# Patient Record
Sex: Female | Born: 1935 | Race: White | Hispanic: No | Marital: Single | Smoking: Former smoker
Health system: Southern US, Community
[De-identification: ages and names within clinical notes are randomized; demographics above are authoritative.]

## PROBLEM LIST (undated history)

## (undated) DIAGNOSIS — I472 Ventricular tachycardia: Secondary | ICD-10-CM

## (undated) DIAGNOSIS — I872 Venous insufficiency (chronic) (peripheral): Secondary | ICD-10-CM

## (undated) DIAGNOSIS — I4891 Unspecified atrial fibrillation: Secondary | ICD-10-CM

## (undated) DIAGNOSIS — I509 Heart failure, unspecified: Secondary | ICD-10-CM

## (undated) DIAGNOSIS — E039 Hypothyroidism, unspecified: Secondary | ICD-10-CM

## (undated) DIAGNOSIS — E785 Hyperlipidemia, unspecified: Secondary | ICD-10-CM

## (undated) DIAGNOSIS — I639 Cerebral infarction, unspecified: Secondary | ICD-10-CM

## (undated) DIAGNOSIS — I10 Essential (primary) hypertension: Secondary | ICD-10-CM

## (undated) DIAGNOSIS — I2510 Atherosclerotic heart disease of native coronary artery without angina pectoris: Secondary | ICD-10-CM

## (undated) DIAGNOSIS — I4721 Torsades de pointes: Secondary | ICD-10-CM

## (undated) DIAGNOSIS — N189 Chronic kidney disease, unspecified: Secondary | ICD-10-CM

## (undated) HISTORY — PX: CHOLECYSTECTOMY: SHX55

---

## 1997-12-14 ENCOUNTER — Ambulatory Visit (HOSPITAL_COMMUNITY): Admission: RE | Admit: 1997-12-14 | Discharge: 1997-12-14 | Payer: Self-pay | Admitting: Family Medicine

## 1998-09-20 ENCOUNTER — Encounter: Payer: Self-pay | Admitting: Family Medicine

## 1998-09-20 ENCOUNTER — Ambulatory Visit (HOSPITAL_COMMUNITY): Admission: RE | Admit: 1998-09-20 | Discharge: 1998-09-20 | Payer: Self-pay | Admitting: Family Medicine

## 1998-10-05 ENCOUNTER — Encounter: Payer: Self-pay | Admitting: General Surgery

## 1998-10-05 ENCOUNTER — Ambulatory Visit (HOSPITAL_COMMUNITY): Admission: RE | Admit: 1998-10-05 | Discharge: 1998-10-06 | Payer: Self-pay | Admitting: General Surgery

## 1999-03-01 ENCOUNTER — Other Ambulatory Visit: Admission: RE | Admit: 1999-03-01 | Discharge: 1999-03-01 | Payer: Self-pay | Admitting: Family Medicine

## 1999-11-05 ENCOUNTER — Encounter: Payer: Self-pay | Admitting: Family Medicine

## 1999-11-05 ENCOUNTER — Ambulatory Visit (HOSPITAL_COMMUNITY): Admission: RE | Admit: 1999-11-05 | Discharge: 1999-11-05 | Payer: Self-pay | Admitting: Family Medicine

## 1999-11-08 ENCOUNTER — Encounter: Payer: Self-pay | Admitting: Family Medicine

## 1999-11-08 ENCOUNTER — Ambulatory Visit (HOSPITAL_COMMUNITY): Admission: RE | Admit: 1999-11-08 | Discharge: 1999-11-08 | Payer: Self-pay | Admitting: Specialist

## 1999-11-08 ENCOUNTER — Encounter: Payer: Self-pay | Admitting: Specialist

## 1999-11-08 ENCOUNTER — Ambulatory Visit (HOSPITAL_COMMUNITY): Admission: RE | Admit: 1999-11-08 | Discharge: 1999-11-08 | Payer: Self-pay | Admitting: Family Medicine

## 1999-11-20 ENCOUNTER — Encounter: Admission: RE | Admit: 1999-11-20 | Discharge: 2000-02-18 | Payer: Self-pay | Admitting: Psychiatry

## 2000-06-04 ENCOUNTER — Other Ambulatory Visit: Admission: RE | Admit: 2000-06-04 | Discharge: 2000-06-04 | Payer: Self-pay | Admitting: Family Medicine

## 2000-08-06 ENCOUNTER — Encounter: Admission: RE | Admit: 2000-08-06 | Discharge: 2000-08-06 | Payer: Self-pay | Admitting: Family Medicine

## 2000-08-06 ENCOUNTER — Encounter: Payer: Self-pay | Admitting: Family Medicine

## 2001-12-16 ENCOUNTER — Encounter (HOSPITAL_BASED_OUTPATIENT_CLINIC_OR_DEPARTMENT_OTHER): Admission: RE | Admit: 2001-12-16 | Discharge: 2002-03-16 | Payer: Self-pay | Admitting: Internal Medicine

## 2002-03-22 ENCOUNTER — Encounter (HOSPITAL_BASED_OUTPATIENT_CLINIC_OR_DEPARTMENT_OTHER): Admission: RE | Admit: 2002-03-22 | Discharge: 2002-03-31 | Payer: Self-pay | Admitting: Internal Medicine

## 2003-09-21 ENCOUNTER — Inpatient Hospital Stay (HOSPITAL_COMMUNITY): Admission: EM | Admit: 2003-09-21 | Discharge: 2003-09-25 | Payer: Self-pay | Admitting: *Deleted

## 2003-09-21 ENCOUNTER — Encounter (INDEPENDENT_AMBULATORY_CARE_PROVIDER_SITE_OTHER): Payer: Self-pay | Admitting: Cardiology

## 2003-09-25 ENCOUNTER — Encounter: Payer: Self-pay | Admitting: Cardiology

## 2004-08-07 ENCOUNTER — Encounter (HOSPITAL_BASED_OUTPATIENT_CLINIC_OR_DEPARTMENT_OTHER): Admission: RE | Admit: 2004-08-07 | Discharge: 2004-09-13 | Payer: Self-pay | Admitting: Internal Medicine

## 2005-01-27 ENCOUNTER — Encounter (HOSPITAL_BASED_OUTPATIENT_CLINIC_OR_DEPARTMENT_OTHER): Admission: RE | Admit: 2005-01-27 | Discharge: 2005-04-27 | Payer: Self-pay | Admitting: Surgery

## 2005-04-29 ENCOUNTER — Encounter (HOSPITAL_BASED_OUTPATIENT_CLINIC_OR_DEPARTMENT_OTHER): Admission: RE | Admit: 2005-04-29 | Discharge: 2005-05-02 | Payer: Self-pay | Admitting: Surgery

## 2005-06-09 ENCOUNTER — Encounter (HOSPITAL_BASED_OUTPATIENT_CLINIC_OR_DEPARTMENT_OTHER): Admission: RE | Admit: 2005-06-09 | Discharge: 2005-06-29 | Payer: Self-pay | Admitting: Surgery

## 2005-07-01 ENCOUNTER — Encounter (HOSPITAL_BASED_OUTPATIENT_CLINIC_OR_DEPARTMENT_OTHER): Admission: RE | Admit: 2005-07-01 | Discharge: 2005-07-31 | Payer: Self-pay | Admitting: Surgery

## 2010-06-30 ENCOUNTER — Inpatient Hospital Stay (HOSPITAL_COMMUNITY)
Admission: EM | Admit: 2010-06-30 | Discharge: 2010-07-05 | Disposition: A | Discharge status: Other Acute Inpatient Hospital | Payer: Self-pay | Source: Home / Self Care | Attending: Cardiology | Admitting: Cardiology

## 2010-07-03 ENCOUNTER — Inpatient Hospital Stay (HOSPITAL_COMMUNITY)
Admission: AD | Admit: 2010-07-03 | Discharge: 2010-07-05 | Payer: Self-pay | Attending: Cardiology | Admitting: Cardiology

## 2010-07-03 LAB — CBC
HCT: 33.3 % — ABNORMAL LOW (ref 36.0–46.0)
Hemoglobin: 10.4 g/dL — ABNORMAL LOW (ref 12.0–15.0)
MCH: 27 pg (ref 26.0–34.0)
MCHC: 31.2 g/dL (ref 30.0–36.0)
MCV: 86.5 fL (ref 78.0–100.0)
Platelets: 154 10*3/uL (ref 150–400)
RBC: 3.85 MIL/uL — ABNORMAL LOW (ref 3.87–5.11)
RDW: 17.5 % — ABNORMAL HIGH (ref 11.5–15.5)
WBC: 5.2 10*3/uL (ref 4.0–10.5)

## 2010-07-03 LAB — PROTIME-INR
INR: 1.18 (ref 0.00–1.49)
Prothrombin Time: 15.2 seconds (ref 11.6–15.2)

## 2010-07-03 LAB — BASIC METABOLIC PANEL
BUN: 24 mg/dL — ABNORMAL HIGH (ref 6–23)
CO2: 26 mEq/L (ref 19–32)
Calcium: 9.2 mg/dL (ref 8.4–10.5)
Chloride: 103 mEq/L (ref 96–112)
Creatinine, Ser: 1.17 mg/dL (ref 0.4–1.2)
GFR calc Af Amer: 54 mL/min — ABNORMAL LOW (ref >60)
GFR calc non Af Amer: 45 mL/min — ABNORMAL LOW (ref >60)
Glucose, Bld: 112 mg/dL — ABNORMAL HIGH (ref 70–99)
Potassium: 4 mEq/L (ref 3.5–5.1)
Sodium: 137 mEq/L (ref 135–145)

## 2010-07-03 LAB — BRAIN NATRIURETIC PEPTIDE: Pro B Natriuretic peptide (BNP): 66.4 pg/mL (ref 0.0–100.0)

## 2010-07-03 LAB — HEPARIN LEVEL (UNFRACTIONATED): Heparin Unfractionated: 0.2 IU/mL — ABNORMAL LOW (ref 0.30–0.70)

## 2010-07-04 LAB — PROTIME-INR
INR: 1.13 (ref 0.00–1.49)
Prothrombin Time: 14.7 seconds (ref 11.6–15.2)

## 2010-07-04 LAB — BASIC METABOLIC PANEL
BUN: 20 mg/dL (ref 6–23)
CO2: 30 mEq/L (ref 19–32)
Calcium: 9.4 mg/dL (ref 8.4–10.5)
Chloride: 104 mEq/L (ref 96–112)
Creatinine, Ser: 1.22 mg/dL — ABNORMAL HIGH (ref 0.4–1.2)
GFR calc Af Amer: 52 mL/min — ABNORMAL LOW (ref >60)
GFR calc non Af Amer: 43 mL/min — ABNORMAL LOW (ref >60)
Glucose, Bld: 111 mg/dL — ABNORMAL HIGH (ref 70–99)
Potassium: 3.8 mEq/L (ref 3.5–5.1)
Sodium: 141 mEq/L (ref 135–145)

## 2010-07-04 LAB — CBC
HCT: 33.5 % — ABNORMAL LOW (ref 36.0–46.0)
Hemoglobin: 10.4 g/dL — ABNORMAL LOW (ref 12.0–15.0)
MCH: 26.8 pg (ref 26.0–34.0)
MCHC: 31 g/dL (ref 30.0–36.0)
MCV: 86.3 fL (ref 78.0–100.0)
Platelets: 158 10*3/uL (ref 150–400)
RBC: 3.88 MIL/uL (ref 3.87–5.11)
RDW: 17.5 % — ABNORMAL HIGH (ref 11.5–15.5)
WBC: 5.6 10*3/uL (ref 4.0–10.5)

## 2010-07-04 LAB — BRAIN NATRIURETIC PEPTIDE: Pro B Natriuretic peptide (BNP): 446 pg/mL — ABNORMAL HIGH (ref 0.0–100.0)

## 2010-07-05 LAB — BASIC METABOLIC PANEL
BUN: 18 mg/dL (ref 6–23)
CO2: 30 mEq/L (ref 19–32)
Calcium: 9.4 mg/dL (ref 8.4–10.5)
Chloride: 100 mEq/L (ref 96–112)
Creatinine, Ser: 1.26 mg/dL — ABNORMAL HIGH (ref 0.4–1.2)
GFR calc Af Amer: 50 mL/min — ABNORMAL LOW (ref >60)
GFR calc non Af Amer: 41 mL/min — ABNORMAL LOW (ref >60)
Glucose, Bld: 109 mg/dL — ABNORMAL HIGH (ref 70–99)
Potassium: 3.4 mEq/L — ABNORMAL LOW (ref 3.5–5.1)
Sodium: 139 mEq/L (ref 135–145)

## 2010-07-05 LAB — PROTIME-INR
INR: 1.59 — ABNORMAL HIGH (ref 0.00–1.49)
Prothrombin Time: 19.1 seconds — ABNORMAL HIGH (ref 11.6–15.2)

## 2010-07-29 NOTE — Discharge Summary (Signed)
NAME:  [name], [name]               ACCOUNT NO.:  [id number]  MEDICAL RECORD NO.:  [id number]          PATIENT TYPE:  INP  LOCATION:  6527                         FACILITY:  [facility]  PHYSICIAN:  Ezzard Standing, M.D.DATE OF BIRTH:  Jul 28, 1929  DATE OF ADMISSION:  07/03/2010 DATE OF DISCHARGE:  07/05/2010                              DISCHARGE SUMMARY   FINAL DIAGNOSES: 1. Acute systolic congestive heart failure, initial episode. 2. Cardiomyopathy, mixed picture ischemic and nonischemic. 3. Rapid atrial fibrillation with persistent atrial fibrillation. 4. Morbid obesity. 5. Previous history of stroke with residual left hemiparesis. 6. Hypothyroidism under treatment. 7. Hypertensive heart disease. 8. Hyperlipidemia. 9. Chronic venous insufficiency. 10.Previous history of torsade de pointes, on Avelox treatment.  PROCEDURES:  2D echocardiogram, cardiac catheterization.  HISTORY:  The patient is a 77 year old female, has a history of torsade de pointes that was induced by Avelox.  She had a previous stroke, hypertension and hyperlipidemia.  She presented to the emergency room with dyspnea for about 7 days.  She was seen by primary care physician and treated for bronchitis and later went to an Urgent Care who found her to be in atrial fibrillation with rapid response, and she was sent to the emergency room for further evaluation.  She has chronic lower extremity edema and chronic venous insufficiency there.  She was admitted for treatment of rapid atrial fibrillation to [facility].  Please see the previously dictated history and physical for remainder of the details.  HOSPITAL COURSE:  Hemoglobin is 11.4, hematocrit 36.4, INR is 1.28 on admission.  Sodium is 142, potassium is 3.4, chloride 104, CO2 of 27, glucose 153, BUN is 20 with creatinine of 1.47.  Liver enzymes were normal.  Calcium was 10.2.  B-natriuretic peptide was 810, T4 was 1.57, TSH is 2.733, troponin is  0.03.  The patient was admitted to the hospital and begun on intravenous Cardizem.  She had an echocardiogram done the next day that showed an ejection fraction of 20-25% and significant mitral regurgitation.  There were regional wall motion abnormalities noted.  Her atrial fibrillation came under control with diltiazem, and she was placed on heparin.  She was transferred to [facility], underwent catheterization on the 4th.  She had coronary artery disease identified with an occluded distal right coronary artery and left to right collaterals.  She had moderately diffuse disease involving the LAD and the circumflex with the coronary artery disease of somewhat out of proportion to the degree of LV dysfunction.  She had significant mitral regurgitation also noted.  Because of this, it was thought because of her age, obesity, and previous stroke that she would be treated medically.  She was initiated on treatment with Benicar that she had previously been on and begun on beta-blocker therapy, her atrial fibrillation rate came under control.  She was treated with warfarin for anticoagulation and had an INR of 1.59 on the day of discharge, and will continue to drift up from here.  She tolerated the catheterization well, and she was quite sedentary, but felt to be stable at the time of discharge.  Her BNP level  fell to 440.  She is discharged at this time on the following medications: 1. Carvedilol 6.25 b.i.d. 2. Diltiazem sustained release 120 mg b.i.d. 3. Furosemide 40 mg daily. 4. K-Dur 20 mEq daily. 5. Lipitor 40 mg daily. 6. Benicar 40 mg daily. 7. Spironolactone 25 mg daily. 8. Warfarin 5 mg daily. 9. Aspirin 81 mg daily. 10.Alprazolam 0.5 mg at bedtime as needed for sleep. 11.Calcium carbonate/vitamin D daily. 12.Levothyroxine 0.175 mg daily. 13.Oxybutynin 5 mg t.i.d.  She is instructed to weigh daily.  She was given instructions regarding warfarin anticoagulation.  She  may need to have a sleep study as an outpatient.  At the present time, she is not deemed a candidate for cardioversion for antiarrhythmic therapy because of her previous history of torsade de pointes.  She is to follow up with me in 1 week for protime in office visit.  She is to have a protime drawn on Monday by home health nurse.  She was also given a referral to home health nurse and also have a bedside commode.  ADDITIONAL DIAGNOSES:  Coronary artery disease and mitral regurgitation.     Ezzard Standing, M.D.     WST/MEDQ  D:  07/05/2010  T:  07/06/2010  Job:  [id number]  cc:   [name], M.D.  Electronically Signed by Viona Gilmore. Wynonia Lawman M.D. on 07/11/2010 05:32:48 PM

## 2010-07-29 NOTE — Procedures (Signed)
NAME:  [name], [name] NO.:  [id number]  MEDICAL RECORD NO.:  [id number]          PATIENT TYPE:  INP  LOCATION:  6527                         FACILITY:  [facility]  PHYSICIAN:  Ezzard Standing, M.D.DATE OF BIRTH:  [birth year]  DATE OF PROCEDURE:  07/03/2010                            CARDIAC CATHETERIZATION   HISTORY:  This is a 77 year old female who presented to the hospital with congestive heart failure and rapid atrial fibrillation.  She was found to have an ejection fraction of 20-25% with inferior wall motion abnormality.  Catheterization is done to assess for coronary artery disease as the possible etiology for her LV function decrease.  PROCEDURE:  Left heart catheterization with coronary angiograms and left ventriculogram.  OPERATOR:  W. Tollie Eth, MD  PROCEDURE IN DETAIL:  The patient was brought to the cath lab from [facility] and was prepped and draped in the usual manner. After Xylocaine anesthesia, a 5-French sheath was placed in the right femoral artery percutaneously using a single anterior needle wall stick. Angiograms were made using 5-French catheters and a 25 mL ventriculogram was performed.  She was then taken to the holding area for sheath removal and tolerated the procedure well.  HEMODYNAMIC DATA:  Aorta precontrast 120/24-28 and aorta postcontrast 120/80.  ANGIOGRAPHIC DATA:  Left ventriculogram:  Performed in the 30 degrees RAO projection.  The aortic valve is normal.  There appears to be severe mitral regurgitation with a dilated left atrium.  The ventricle is dilated and has severe diffuse hypokinesis with an estimated ejection fraction of 20%.  The inferior wall appears to be somewhat akinetic. Coronary arteries arise and distribute normally.  Calcifications noted involving both the left and the right coronary systems with a mild degree.  The left main coronary artery is normal.  The left anterior descending is  calcified proximally.  There is diffuse narrowing in the midportion of the vessel into the apex but does not appear to be severely obstructed.  There is diffuse narrowing of at least 60-70%, however.  Circumflex coronary artery has a large marginal branch.  There is moderate irregularity proximally and a 50-6-% midvessel stenosis noted.  The right coronary artery has a severe 99.9% proximal stenosis and slow antegrade flow.  There is diffuse disease in the midportion of the right coronary artery of 70-80% and then a small posterior descending artery is noted.  The vessel is occluded after the posterior descending artery and fills by left-to-right collaterals.  IMPRESSION: 1. Severe left ventricular dysfunction with an estimated ejection     fraction of 20%. 2. Severe mitral regurgitation. 3. Marked elevation of left ventricular end-diastolic pressure. 4. Coronary artery disease with occlusion of the distal right coronary     artery with collaterals.  Moderate diffuse disease involving the     circumflex and left anterior descending which is not amenable to     percutaneous     intervention and diffuse disease, somewhat out of portion in the     degree of left ventricular dysfunction and that it is not severe in     the left anterior descending and circumflex.  RECOMMENDATIONS:  Continued medical therapy, treatment of heart failure, and warfarin for anticoagulation.     Ezzard Standing, M.D.     WST/MEDQ  D:  07/04/2010  T:  07/04/2010  Job:  [id number]  cc:   [name], M.D.  Electronically Signed by Viona Gilmore. Wynonia Lawman M.D. on 07/11/2010 05:32:41 PM

## 2010-09-09 LAB — CBC
HCT: 35.1 % — ABNORMAL LOW (ref 36.0–46.0)
HCT: 36 % (ref 36.0–46.0)
HCT: 36.4 % (ref 36.0–46.0)
Hemoglobin: 11.1 g/dL — ABNORMAL LOW (ref 12.0–15.0)
Hemoglobin: 11.4 g/dL — ABNORMAL LOW (ref 12.0–15.0)
MCH: 26.8 pg (ref 26.0–34.0)
MCH: 26.8 pg (ref 26.0–34.0)
MCHC: 31.3 g/dL (ref 30.0–36.0)
MCV: 85.6 fL (ref 78.0–100.0)
MCV: 86.5 fL (ref 78.0–100.0)
Platelets: 167 10*3/uL (ref 150–400)
RBC: 4.06 MIL/uL (ref 3.87–5.11)
RBC: 4.14 MIL/uL (ref 3.87–5.11)
RBC: 4.25 MIL/uL (ref 3.87–5.11)
RDW: 17.6 % — ABNORMAL HIGH (ref 11.5–15.5)
WBC: 6.5 10*3/uL (ref 4.0–10.5)
WBC: 6.8 10*3/uL (ref 4.0–10.5)

## 2010-09-09 LAB — COMPREHENSIVE METABOLIC PANEL
ALT: 24 U/L (ref 0–35)
AST: 30 U/L (ref 0–37)
Albumin: 3.9 g/dL (ref 3.5–5.2)
Alkaline Phosphatase: 71 U/L (ref 39–117)
BUN: 20 mg/dL (ref 6–23)
CO2: 27 mEq/L (ref 19–32)
Calcium: 10.2 mg/dL (ref 8.4–10.5)
Chloride: 104 mEq/L (ref 96–112)
Creatinine, Ser: 1.47 mg/dL — ABNORMAL HIGH (ref 0.4–1.2)
GFR calc Af Amer: 42 mL/min — ABNORMAL LOW (ref >60)
GFR calc non Af Amer: 34 mL/min — ABNORMAL LOW (ref >60)
Glucose, Bld: 153 mg/dL — ABNORMAL HIGH (ref 70–99)
Potassium: 3.4 mEq/L — ABNORMAL LOW (ref 3.5–5.1)
Sodium: 142 mEq/L (ref 135–145)
Total Bilirubin: 1.6 mg/dL — ABNORMAL HIGH (ref 0.3–1.2)
Total Protein: 7.7 g/dL (ref 6.0–8.3)

## 2010-09-09 LAB — DIFFERENTIAL
Basophils Absolute: 0 10*3/uL (ref 0.0–0.1)
Basophils Relative: 0 % (ref 0–1)
Eosinophils Absolute: 0.2 10*3/uL (ref 0.0–0.7)
Eosinophils Relative: 3 % (ref 0–5)
Lymphocytes Relative: 23 % (ref 12–46)
Lymphs Abs: 1.5 10*3/uL (ref 0.7–4.0)
Monocytes Absolute: 0.6 10*3/uL (ref 0.1–1.0)
Monocytes Relative: 8 % (ref 3–12)
Neutro Abs: 4.4 10*3/uL (ref 1.7–7.7)
Neutrophils Relative %: 66 % (ref 43–77)

## 2010-09-09 LAB — CARDIAC PANEL(CRET KIN+CKTOT+MB+TROPI)
CK, MB: 3 ng/mL (ref 0.3–4.0)
Relative Index: 2.8 — ABNORMAL HIGH (ref 0.0–2.5)
Total CK: 109 U/L (ref 7–177)
Troponin I: 0.03 ng/mL (ref 0.00–0.06)

## 2010-09-09 LAB — TSH: TSH: 2.733 u[IU]/mL (ref 0.350–4.500)

## 2010-09-09 LAB — T3: T3, Total: 60.6 ng/dl — ABNORMAL LOW (ref 80.0–204.0)

## 2010-09-09 LAB — CK TOTAL AND CKMB (NOT AT ARMC)
Relative Index: 3.1 — ABNORMAL HIGH (ref 0.0–2.5)
Relative Index: 3.6 — ABNORMAL HIGH (ref 0.0–2.5)

## 2010-09-09 LAB — BASIC METABOLIC PANEL
BUN: 24 mg/dL — ABNORMAL HIGH (ref 6–23)
CO2: 27 mEq/L (ref 19–32)
Chloride: 102 mEq/L (ref 96–112)
Potassium: 3.3 mEq/L — ABNORMAL LOW (ref 3.5–5.1)

## 2010-09-09 LAB — T4, FREE: Free T4: 1.57 ng/dL (ref 0.80–1.80)

## 2010-09-09 LAB — TROPONIN I: Troponin I: 0.03 ng/mL (ref 0.00–0.06)

## 2010-09-09 LAB — BRAIN NATRIURETIC PEPTIDE: Pro B Natriuretic peptide (BNP): 810 pg/mL — ABNORMAL HIGH (ref 0.0–100.0)

## 2010-09-09 LAB — HEPARIN LEVEL (UNFRACTIONATED): Heparin Unfractionated: 0.3 IU/mL (ref 0.30–0.70)

## 2010-11-15 NOTE — Consult Note (Signed)
NAME:  [name], [name]               ACCOUNT NO.:  [id number]   MEDICAL RECORD NO.:  [id number]          PATIENT TYPE:  REC   LOCATION:  FOOT                         FACILITY:  [facility]   PHYSICIAN:  Epifania Gore. Nils Pyle, M.D.DATE OF BIRTH:  1930-02-06   DATE OF CONSULTATION:  08/08/2004  DATE OF DISCHARGE:                                   CONSULTATION   REASON FOR CONSULTATION:  This is a 77 year old female referred by Herbie Baltimore A.  Reade, M.D. for the evaluation of bilateral stasis ulcers.   IMPRESSION:  Bilateral stasis ulcers.   RECOMMENDATIONS:  Proceed with treatment per Ardelia Mems boot protocol.   HISTORY OF PRESENT ILLNESS:  Ms. Ensminger is a 77 year old female who has  been in good health.  She has had a previous diagnosis of stasis ulceration  and had been prescribed compressive hose.  She has not worn the hose in  several months.  This has been associated with progressive pain,  discoloration, and finally ulceration.   PAST MEDICAL HISTORY:  Remarkable for hypothyroidism, hypertension,  hypercholesterolemia, and bilateral edema and discoloration of the lower  extremities.  She has also sustained a completed stroke in 1987 with a near  complete convalescence recovery.   PAST SURGICAL HISTORY:  Cholecystectomy.   ALLERGIES:  AVELOX which gives her nausea.   CURRENT MEDICATIONS:  1.  Synthroid 100 mcg daily.  2.  Ditropan 5 mg t.i.d.  3.  Benicar 40/25 mg daily.  4.  Crestor 20 mg 1/2 tablet daily.  5.  Lasix 20 mg a day.   FAMILY HISTORY:  Negative for cardiovascular disease or cancer.   SOCIAL HISTORY:  She is married with an adult son in the local area.   REVIEW OF SYSTEMS:  Remarkable for the absence of cardiovascular symptoms.  The remainder of the review of systems is negative.   PHYSICAL EXAMINATION:  Restricted to the lower extremities, shows bilateral  edema, 3+ on the left and 2.5+ on the right.  There is a sloughed blister of  stasis on the left lower extremity with  residual hyperemia and weeping.  Similar, but less severe changes are present on the gaiter area in the right  lower extremity.  The pedal pulses are palpable.  The capillary refill is  normal.  Protective sensation is retained.  There is some weakness in  extension of the right great toe.  The nail beds show some subonychial  accumulations.   DISCUSSION:  The patient has bilateral stasis changes with ulcerations which  are very early bilaterally.  We will proceed with Unna boot protocol and  make a transition to below the knee compression hose once she is healed.      HAN/MEDQ  D:  08/08/2004  T:  08/08/2004  Job:  [id number]   cc:   [name], M.D.  [place]  [place] [address]  Fax: [phone]

## 2010-11-15 NOTE — H&P (Signed)
NAME:  JOHNANNA, [name] NO.:  [id number]   MEDICAL RECORD NO.:  [id number]                   PATIENT TYPE:  EMS   LOCATION:  ED                                   FACILITY:  [facility]   PHYSICIAN:  W. Doristine Church., M.D.         DATE OF BIRTH:  1930-05-01   DATE OF ADMISSION:  09/21/2003  DATE OF DISCHARGE:                                HISTORY & PHYSICAL   HISTORY OF PRESENT ILLNESS:  This is a critical care note on this 77-year-  old female who is admitted to the emergency room with torsades de pointes-  type ventricular tachycardia as well as marked Q-T prolongation in the  setting of nausea and vomiting.  The patient has a prior history of  hypertension, obesity and a prior stroke which has left her relatively  inactive.  She has a week and a half history of an upper respiratory  infection and cough.  She saw her primary care doctor Monday and was  diagnosed with bronchitis and treated with Avelox and hydrocodone cough  syrup.  Since that time, she has had progressive nausea and vomiting and had  one episode of diarrhea.  She has been unable to keep any of her food or  intake down and presented to the [facility] emergency room with severe  weakness, some nose bleed and some hemoptysis and was hypotensive on  admission.  She had a markedly abnormal EKG with diffuse anterior T wave  inversion and marked Q-T prolongation with torsades de pointes.  She was  given potassium and magnesium by the emergency room physician and I was  asked to see the patient.  The patient has not had any chest discomfort,  tightness or pressure suggestive of angina.  She has no prior cardiac  history.  She has not had any significant shortness of breath, although, she  has had cough recently.   PAST MEDICAL HISTORY:  1. Hypertension.  2. She may have had high cholesterol in the past.  3. Previous stroke 18 years ago which has left her with some residual left  hemiparesis.  4. No history of diabetes.   PAST SURGICAL HISTORY:  Cholecystectomy.   ALLERGIES:  None.   CURRENT MEDICATIONS:  1. Avelox 400 mg daily.  2. Synthroid daily.  3. Benicar 40/25 mg one half tablet daily.  4. Hydrocodone cough syrup.   FAMILY HISTORY:  Mother and father are deceased.  No family history of  premature cardiac disease.   SOCIAL HISTORY:  She is married and lives with her husband.  She is a  retired Optometrist.  She quit smoking at the time of her stroke in 1987.  Does not use alcohol to excess.   REVIEW OF SYSTEMS:  She states that she has a cataract.  She has been  moderately obese.  She has no difficulty swallowing.  She has some mild  hearing loss.  She normally  does not have any difficulty with shortness of  breath, cough or pneumonia.  She normally does not have hiatal hernia ulcers  or diverticulosis, diarrhea or hematochezia.  No urinary symptoms.  Normal  has mild venous insufficiency.  Normal has some residual left hemiparesis.  Other than as noted above, the remainder of review of systems is  unremarkable.   PHYSICAL EXAMINATION:  GENERAL APPEARANCE:  She is an elderly woman with dry  mucous membranes.  VITAL SIGNS:  Blood pressure 110/80 with dopamine running, pulse 50 with a  marked Q-T prolongation, occasional PVC's and torsades de pointes are noted.  SKIN:  Mildly cool, not diaphoretic.  HEENT:  EOMI.  PERRL.  CNS: Clear.  Fundi unremarkable.  Pharynx is  negative.  NECK:  Supple without masses.  Right carotid bruit noted.  LUNGS:  Clear bilaterally.  CARDIAC:  Normal S1, S2.  No S3.  There was a [id number] systolic murmur.  ABDOMEN:  Soft, nontender.  Bowel sounds are present.  No rebound.  EXTREMITIES:  Left dorsalis pedis pulses 2+, right 1+.  There are chronic  venous insufficiency changes noted.  Mild decreased strength on the left  side.  EKG shows marked Q-T prolongation with occasional PVC's and some  episodes of torsades de  pointes.   LABORATORY DATA:  Potassium 3.5.  CBC normal.  PT/PTT normal.  Initial CPK  normal.  Troponin 0.05.   IMPRESSION:  1. Marked Q-T prolongation and torsades de pointes which is likely related     to recent Avelox administration.  2. Recent upper respiratory infection.  3. Hypertension.  4. Previous stroke.  5. Elevation of glucose.   RECOMMENDATIONS:  Admit to ICU, supplement magnesium, check magnesium level,  supplemental potassium therapy, taper dopamine when blood pressure is  stable, Foley catheter.                                                Kerry Hough., M.D.    WST/MEDQ  D:  09/21/2003  T:  09/21/2003  Job:  [id number]   cc:   [name], M.D.  [place]  [place] [address]  Fax: [phone]

## 2010-11-15 NOTE — Consult Note (Signed)
[facility]  Patient:    [name], [name] Visit Number: [id number] MRN: [id number]          Service Type: FTC Location: FOOT Attending Physician:  Danny Lawless Dictated by:   Orlando Penner. London Pepper, M.D. Proc. Date: 12/16/01 Admit Date:  12/16/2001   CC:         Herbie Baltimore A. Hurman Horn., M.D.   Consultation Report  HISTORY:  This 77 year old white female is seen at the courtesy of Dr. Alyson Ingles for assistance with management of extensive stasis dermatitis of the left lower extremity and to a lesser degree of the right lower extremity.  The patients history is rather complicated.  She has had a cerebrovascular accident resulting in the foot drop on the left, in particular, and also has had some tendency toward foot drop on the right related to lumbosacral disease.  She is ambulatory with assistance and has had no acute trauma or anything of that nature to her lower extremities.  She has been involved with edema in the lower extremities for a number of years and for some two years has been troubled by extreme erythema and scaliness with occasional open blistered areas on the left lower extremity primarily, pretibial, and extending from about the area of the mid tibia distalward.  Most recently this has been treated by the dermatologists without specific improvement and she has been using only cetaphil and Eucerin.  She reports now for our further assistance.  PAST MEDICAL HISTORY:  Noteworthy for hypertension, hypothyroidism, and cerebrovascular accident as previously indicated.  ALLERGIES:  No known drug allergies.  REGULAR MEDICATIONS: 1. Micardis/HCT 80/12.5 mg daily. 2. Lasix 40 mg daily. 3. K-Dur 20 mEq daily. 4. Synthroid 0.15 mg daily.  PHYSICAL EXAMINATION  EXTREMITIES:  Examination today is limited to the distal lower extremities. Both extremities are mildly edematous but this is significantly more so on the left.  On the right lower  extremity there is some chronic uninflamed stasis change in the distal pretibial area with scaly reddened area measuring approximately 5 x 3.5 cm there.  The left lower extremity shows extensive stasis dermatitis change with extreme erythema and much flaking and scaling of the skin.  There is considerable weeping.  This is in an area extending downward for approximately the mid tibial area to the base of the toes.  There is a slight hallux valgus of that toe and also the hallux is hyperextended.  There is slight clawing of the toes two through five.  There are calluses on the plantar aspects of the first metatarsal heads bilaterally.  DISPOSITION: 1. The patients lower extremities are cleansed appropriately. 2. The scaly area on the distal right lower extremity is treated with    triamcinolone cream, covered with a Unna boot. 3. The extensive erythematous area on the left lower extremity is treated with    application of 0000000 triamcinolone cream then the area is wrapped as well in    a Dome-Paste wrap beneath the Profore dressing. 4. The calluses of the plantar aspects of the first metatarsal heads    bilaterally are sharply pared without incident as is the callus on the    plantar aspect of the right heel. 5. The patient will be seen in follow-up in one week. Dictated by:   Orlando Penner London Pepper, M.D. Attending Physician:  Danny Lawless DD:  12/16/01 TD:  12/17/01 Job: [id number]

## 2010-11-15 NOTE — Discharge Summary (Signed)
NAME:  [name], [name]                         ACCOUNT NO.:  [id number]   MEDICAL RECORD NO.:  [id number]                   PATIENT TYPE:  INP   LOCATION:  [id number]                                 [facility]ACILITY[facility]  [facility]   PHYSICIAN:  Kerry Hough., M.D.         DATE OF BIRTH:  [birth year]   DATE OF ADMISSION:  09/21/2003  DATE OF DISCHARGE:  09/25/2003                                 DISCHARGE SUMMARY   FINAL DIAGNOSES:  1. Marked QT prolongation with torsade de pointes, likely secondary to     Avelox.  2. Nausea and vomiting, dehydration likely secondary to Avelox.  3. Hypertension.  4. Stroke.  5. Recent upper respiratory infection.  6. Elevation of glucose.   PROCEDURE:  1. Echocardiogram.  2. Adenosine Cardiolite scan.   HISTORY:  This 77 year old female has a prior history of hypertension,  obesity and previous stroke and has been inactive.  She has a week-and-a-  half history of an upper respiratory infection and cough.  She saw her  primary doctor Monday and was treated with Avelox and had hydrocodone cough  syrup.  She developed progressive nausea, vomiting, and some diarrhea and  was unable to keep any of her food or intake down.  She presented to the  [facility] Emergency Room with severe weakness, nosebleeding, hemoptysis,  and was hypotensive on admission.  She was found to have a markedly abnormal  EKG with marked QT prolongation with seven episodes of torsade de pointes in  the emergency room.  I was asked to consult on her and she is admitted to  the hospital for treatment of prolonged QT interval and torsade de pointes.  Please see the previously dictated history and physical for the remainder of  the details.   HOSPITAL COURSE:  The patient was critically ill and was admitted to the  intensive care unit.  She was treated with IV hydration and given  intravenous magnesium and potassium supplementation.  With this, she had  less torsade de pointes but still had a  markedly prolonged QT.  Her Avelox  was held.  Glucose was elevated at 174.  AST was 82.  ALT was 59.  CPK and  troponin were unremarkable.  An echocardiogram was performed with findings  of the left ventricular ejection fraction of 55-65%.  Left atrium was  moderately dilated.  By March 26, she was feeling much better with no more  nausea and vomiting and her pressure was stable.  She had a markedly  prolonged QT interval which appeared to be improving slowly.  She did have  persistent bradycardia and her adenosine Cardiolite was planned on September 25, 2003.  Her adenosine Cardiolite was done on September 25, 2003 showing no  ischemia and ejection fraction of 59% and it was felt that she could be  discharged home.   DISCHARGE MEDICATIONS:  She was discharged home in improved condition on:  1. Synthroid  daily.  2. Benicar 40/25 daily.  3. Hydrocodone cough syrup.  4. She was recommended to never take Avelox or quinolone agents again.  This     will be reported to the FDA.  By the time of discharge, her QT interval     had markedly shortened.                                                Kerry Hough., M.D.    WST/MEDQ  D:  10/11/2003  T:  10/12/2003  Job:  [id number]   cc:   [name], M.D.  [place]  [place] [address]  Fax: [phone]

## 2011-08-22 ENCOUNTER — Other Ambulatory Visit: Payer: Self-pay | Admitting: Cardiology

## 2011-09-09 ENCOUNTER — Other Ambulatory Visit: Payer: Self-pay | Admitting: Cardiology

## 2011-09-17 ENCOUNTER — Other Ambulatory Visit: Payer: Self-pay | Admitting: Cardiology

## 2011-10-01 ENCOUNTER — Other Ambulatory Visit: Payer: Self-pay | Admitting: Cardiology

## 2011-12-12 ENCOUNTER — Encounter (HOSPITAL_COMMUNITY): Payer: Self-pay | Admitting: *Deleted

## 2011-12-12 ENCOUNTER — Emergency Department (HOSPITAL_COMMUNITY): Payer: Medicare Other

## 2011-12-12 ENCOUNTER — Inpatient Hospital Stay (HOSPITAL_COMMUNITY)
Admission: EM | Admit: 2011-12-12 | Discharge: 2011-12-19 | DRG: 872 | Disposition: A | Discharge status: Home Health | Payer: Medicare Other | Attending: Internal Medicine | Admitting: Internal Medicine

## 2011-12-12 DIAGNOSIS — I428 Other cardiomyopathies: Secondary | ICD-10-CM | POA: Diagnosis present

## 2011-12-12 DIAGNOSIS — E876 Hypokalemia: Secondary | ICD-10-CM | POA: Diagnosis not present

## 2011-12-12 DIAGNOSIS — I509 Heart failure, unspecified: Secondary | ICD-10-CM | POA: Diagnosis present

## 2011-12-12 DIAGNOSIS — E039 Hypothyroidism, unspecified: Secondary | ICD-10-CM | POA: Diagnosis present

## 2011-12-12 DIAGNOSIS — L0390 Cellulitis, unspecified: Secondary | ICD-10-CM

## 2011-12-12 DIAGNOSIS — E785 Hyperlipidemia, unspecified: Secondary | ICD-10-CM | POA: Diagnosis present

## 2011-12-12 DIAGNOSIS — A419 Sepsis, unspecified organism: Secondary | ICD-10-CM | POA: Diagnosis present

## 2011-12-12 DIAGNOSIS — Z79899 Other long term (current) drug therapy: Secondary | ICD-10-CM

## 2011-12-12 DIAGNOSIS — R509 Fever, unspecified: Secondary | ICD-10-CM

## 2011-12-12 DIAGNOSIS — Z7901 Long term (current) use of anticoagulants: Secondary | ICD-10-CM

## 2011-12-12 DIAGNOSIS — I5022 Chronic systolic (congestive) heart failure: Secondary | ICD-10-CM | POA: Diagnosis present

## 2011-12-12 DIAGNOSIS — Z8673 Personal history of transient ischemic attack (TIA), and cerebral infarction without residual deficits: Secondary | ICD-10-CM

## 2011-12-12 DIAGNOSIS — N179 Acute kidney failure, unspecified: Secondary | ICD-10-CM | POA: Diagnosis present

## 2011-12-12 DIAGNOSIS — L02419 Cutaneous abscess of limb, unspecified: Secondary | ICD-10-CM | POA: Diagnosis present

## 2011-12-12 DIAGNOSIS — I129 Hypertensive chronic kidney disease with stage 1 through stage 4 chronic kidney disease, or unspecified chronic kidney disease: Secondary | ICD-10-CM | POA: Diagnosis present

## 2011-12-12 DIAGNOSIS — N390 Urinary tract infection, site not specified: Secondary | ICD-10-CM | POA: Diagnosis present

## 2011-12-12 DIAGNOSIS — I2510 Atherosclerotic heart disease of native coronary artery without angina pectoris: Secondary | ICD-10-CM | POA: Diagnosis present

## 2011-12-12 DIAGNOSIS — Z66 Do not resuscitate: Secondary | ICD-10-CM | POA: Diagnosis present

## 2011-12-12 DIAGNOSIS — A4159 Other Gram-negative sepsis: Principal | ICD-10-CM | POA: Diagnosis present

## 2011-12-12 DIAGNOSIS — N189 Chronic kidney disease, unspecified: Secondary | ICD-10-CM | POA: Diagnosis present

## 2011-12-12 DIAGNOSIS — I4891 Unspecified atrial fibrillation: Secondary | ICD-10-CM | POA: Diagnosis present

## 2011-12-12 HISTORY — DX: Hypothyroidism, unspecified: E03.9

## 2011-12-12 HISTORY — DX: Venous insufficiency (chronic) (peripheral): I87.2

## 2011-12-12 HISTORY — DX: Heart failure, unspecified: I50.9

## 2011-12-12 HISTORY — DX: Chronic kidney disease, unspecified: N18.9

## 2011-12-12 HISTORY — DX: Essential (primary) hypertension: I10

## 2011-12-12 HISTORY — DX: Torsades de pointes: I47.21

## 2011-12-12 HISTORY — DX: Ventricular tachycardia: I47.2

## 2011-12-12 HISTORY — DX: Atherosclerotic heart disease of native coronary artery without angina pectoris: I25.10

## 2011-12-12 HISTORY — DX: Hyperlipidemia, unspecified: E78.5

## 2011-12-12 HISTORY — DX: Unspecified atrial fibrillation: I48.91

## 2011-12-12 HISTORY — DX: Cerebral infarction, unspecified: I63.9

## 2011-12-12 LAB — URINE MICROSCOPIC-ADD ON

## 2011-12-12 LAB — DIFFERENTIAL
Basophils Absolute: 0 10*3/uL (ref 0.0–0.1)
Basophils Relative: 0 % (ref 0–1)
Eosinophils Relative: 0 % (ref 0–5)
Monocytes Absolute: 0.7 10*3/uL (ref 0.1–1.0)

## 2011-12-12 LAB — CBC
HCT: 35.1 % — ABNORMAL LOW (ref 36.0–46.0)
MCH: 29.2 pg (ref 26.0–34.0)
MCHC: 33.3 g/dL (ref 30.0–36.0)
MCV: 87.5 fL (ref 78.0–100.0)
RDW: 14.5 % (ref 11.5–15.5)

## 2011-12-12 LAB — COMPREHENSIVE METABOLIC PANEL
AST: 18 U/L (ref 0–37)
Albumin: 3.4 g/dL — ABNORMAL LOW (ref 3.5–5.2)
CO2: 23 mEq/L (ref 19–32)
Calcium: 9.4 mg/dL (ref 8.4–10.5)
Creatinine, Ser: 1.52 mg/dL — ABNORMAL HIGH (ref 0.50–1.10)
GFR calc non Af Amer: 31 mL/min — ABNORMAL LOW (ref >90)

## 2011-12-12 LAB — URINALYSIS, ROUTINE W REFLEX MICROSCOPIC
Specific Gravity, Urine: 1.018 (ref 1.005–1.030)
Urobilinogen, UA: 0.2 mg/dL (ref 0.0–1.0)

## 2011-12-12 LAB — PROCALCITONIN: Procalcitonin: 4.03 ng/mL

## 2011-12-12 LAB — PROTIME-INR: INR: 2.34 — ABNORMAL HIGH (ref 0.00–1.49)

## 2011-12-12 LAB — LACTIC ACID, PLASMA: Lactic Acid, Venous: 1.7 mmol/L (ref 0.5–2.2)

## 2011-12-12 MED ORDER — ACETAMINOPHEN 325 MG PO TABS: 650.0000 mg | ORAL_TABLET | Freq: Once | ORAL | Status: AC

## 2011-12-12 MED ORDER — DEXTROSE 5 % IV SOLN
1.0000 g | Freq: Once | INTRAVENOUS | Status: AC
  Administered 2011-12-12: 1 g via INTRAVENOUS
  Filled 2011-12-12: qty 10

## 2011-12-12 MED ORDER — SODIUM CHLORIDE 0.9 % IV SOLN
Freq: Once | INTRAVENOUS | Status: AC
  Administered 2011-12-12: 22:00:00 via INTRAVENOUS

## 2011-12-12 MED ORDER — SODIUM CHLORIDE 0.9 % IV BOLUS (SEPSIS): 1000.0000 mL | Freq: Once | INTRAVENOUS | Status: DC

## 2011-12-12 MED ORDER — DILTIAZEM HCL 25 MG/5ML IV SOLN
10.0000 mg | Freq: Once | INTRAVENOUS | Status: DC
  Filled 2011-12-12: qty 5

## 2011-12-12 MED ORDER — ACETAMINOPHEN 325 MG PO TABS
650.0000 mg | ORAL_TABLET | Freq: Once | ORAL | Status: AC
  Administered 2011-12-12: 650 mg via ORAL
  Filled 2011-12-12: qty 2

## 2011-12-12 MED ORDER — DILTIAZEM LOAD VIA INFUSION: 10.0000 mg | Freq: Once | INTRAVENOUS | Status: DC

## 2011-12-12 NOTE — ED Notes (Signed)
Per EMS pt in from home, family reports confusion since today. Pt smells of urine, lives alone.

## 2011-12-12 NOTE — ED Notes (Signed)
[id number] Expected date:<BR> Expected time:<BR> Means of arrival:<BR> Comments:<BR> EMS 10 GC, 23 yof UTI with lethargy

## 2011-12-12 NOTE — ED Provider Notes (Signed)
History     CSN: [id number]  Arrival date & time 12/12/11  2034   First MD Initiated Contact with Patient 12/12/11 2105      Chief Complaint  Patient presents with  . Altered Mental Status   level V caveat applies secondary to altered mental status   HPI  History provided by the patient and daughter. Patient is 78 year old female with history of hypertension, CHF and A. fib on Coumadin who presents with increased confusion earlier today at home. Patient lives alone in a small apartment and daughter visits 3 times a day. After visiting this evening she was concerned the patient was confused and acting abnormally. Patient was acting more normal earlier in the day. Patient was completely fine yesterday. Daughter does believe patient seemed to be breathing more rapidly. Patient's daughter did not notice any other symptoms. She had not noticed any cough recently. Patient did smell of urine but has history of frequent urination. Patient denies having any cough, vomiting or diarrhea. Patient is not complaining of any chest or abdominal pains.    Past Medical History  Diagnosis Date  . Thyroid disease   . Hypertension   . Atrial fibrillation   . CHF (congestive heart failure)     Past Surgical History  Procedure Date  . Cholecystectomy     History reviewed. No pertinent family history.  History  Substance Use Topics  . Smoking status: Not on file  . Smokeless tobacco: Not on file  . Alcohol Use: No    OB History    Grav Para Term Preterm Abortions TAB SAB Ect Mult Living                  Review of Systems  Unable to perform ROS: Mental status change  Respiratory: Positive for shortness of breath. Negative for cough.   Cardiovascular: Negative for chest pain.  Gastrointestinal: Negative for vomiting, abdominal pain and diarrhea.  Genitourinary: Positive for frequency.    Allergies  Avelox  Home Medications   Current Outpatient Rx  Name Route Sig Dispense Refill    . ASPIRIN 81 MG PO CHEW Oral Chew 81 mg by mouth daily.    . ATORVASTATIN CALCIUM 80 MG PO TABS Oral Take 40 mg by mouth every other day.     Marland Kitchen CARVEDILOL 25 MG PO TABS Oral Take 25 mg by mouth 2 (two) times daily with a meal.    . FUROSEMIDE 40 MG PO TABS Oral Take 40 mg by mouth daily.    Marland Kitchen LEVOTHYROXINE SODIUM 175 MCG PO TABS Oral Take 175 mcg by mouth daily.    Marland Kitchen LOSARTAN POTASSIUM 100 MG PO TABS Oral Take 100 mg by mouth daily.    . OXYBUTYNIN CHLORIDE 5 MG PO TABS Oral Take 5 mg by mouth 3 (three) times daily. Bladder spasms    . POTASSIUM CHLORIDE CRYS ER 20 MEQ PO TBCR Oral Take 20 mEq by mouth daily.    . WARFARIN SODIUM 2 MG PO TABS Oral Take 1-2 mg by mouth See admin instructions. Wednesdays and saturdays take 1 mg. All other days take 2mg .      BP 117/61  Pulse 106  Temp 101.7 F (38.7 C) (Oral)  Resp 22  Wt 220 lb (99.791 kg)  SpO2 94%  Physical Exam  Nursing note and vitals reviewed. Constitutional: She is oriented to person, place, and time. She appears well-developed and well-nourished. No distress.  HENT:  Head: Normocephalic and atraumatic.  Eyes: Conjunctivae and  EOM are normal. Pupils are equal, round, and reactive to light.  Neck: Normal range of motion. Neck supple.       No meningeal signs  Cardiovascular: Normal rate and regular rhythm.   Pulmonary/Chest: Tachypnea noted. No respiratory distress. She has rales.  Abdominal: Soft. There is no tenderness. There is no rebound and no guarding.  Musculoskeletal:       Edema to bilateral lower extremities greatest on the left lower extremity. There is an old appearing erythematous dry scaly plaque to the anterior left ankle and foot. Daughter reports this is chronic. There is new increased edema and erythema with warmth extending up the leg to midcalf and shin. Patient has tenderness to the leg.  Neurological: She is alert and oriented to person, place, and time. No cranial nerve deficit or sensory deficit.        Strength equal bilaterally  Skin: Skin is warm and dry.       Erythema and swelling to left lower extremity concerning for possible cellulitis. See musculoskeletal exam.  Psychiatric: She has a normal mood and affect. Her behavior is normal.    ED Course  Procedures   Results for orders placed during the hospital encounter of 12/12/11  CBC      Component Value Range   WBC 14.6 (*) 4.0 - 10.5 K/uL   RBC 4.01  3.87 - 5.11 MIL/uL   Hemoglobin 11.7 (*) 12.0 - 15.0 g/dL   HCT 35.1 (*) 36.0 - 46.0 %   MCV 87.5  78.0 - 100.0 fL   MCH 29.2  26.0 - 34.0 pg   MCHC 33.3  30.0 - 36.0 g/dL   RDW 14.5  11.5 - 15.5 %   Platelets 156  150 - 400 K/uL  DIFFERENTIAL      Component Value Range   Neutrophils Relative 89 (*) 43 - 77 %   Neutro Abs 13.1 (*) 1.7 - 7.7 K/uL   Lymphocytes Relative 6 (*) 12 - 46 %   Lymphs Abs 0.8  0.7 - 4.0 K/uL   Monocytes Relative 5  3 - 12 %   Monocytes Absolute 0.7  0.1 - 1.0 K/uL   Eosinophils Relative 0  0 - 5 %   Eosinophils Absolute 0.0  0.0 - 0.7 K/uL   Basophils Relative 0  0 - 1 %   Basophils Absolute 0.0  0.0 - 0.1 K/uL  COMPREHENSIVE METABOLIC PANEL      Component Value Range   Sodium 134 (*) 135 - 145 mEq/L   Potassium 3.5  3.5 - 5.1 mEq/L   Chloride 99  96 - 112 mEq/L   CO2 23  19 - 32 mEq/L   Glucose, Bld 157 (*) 70 - 99 mg/dL   BUN 26 (*) 6 - 23 mg/dL   Creatinine, Ser 1.52 (*) 0.50 - 1.10 mg/dL   Calcium 9.4  8.4 - 10.5 mg/dL   Total Protein 7.1  6.0 - 8.3 g/dL   Albumin 3.4 (*) 3.5 - 5.2 g/dL   AST 18  0 - 37 U/L   ALT 13  0 - 35 U/L   Alkaline Phosphatase 70  39 - 117 U/L   Total Bilirubin 1.1  0.3 - 1.2 mg/dL   GFR calc non Af Amer 31 (*) >90 mL/min   GFR calc Af Amer 36 (*) >90 mL/min  PROTIME-INR      Component Value Range   Prothrombin Time 26.0 (*) 11.6 - 15.2 seconds   INR 2.34 (*)  0.00 - 1.49       Dg Chest 2 View  12/12/2011  *RADIOLOGY REPORT*  Clinical Data: Shortness of breath, cough and fever.  CHEST - 2 VIEW   Comparison: Chest x-ray 06/30/2010.  Findings: Lung volumes are low.  There is pulmonary venous congestion without frank pulmonary edema.  Bibasilar opacities are favored to represent areas of subsegmental atelectasis.  No definite pleural effusions.  Heart size is moderately enlarged (unchanged). The patient is rotated to the right on today's exam, resulting in distortion of the mediastinal contours and reduced diagnostic sensitivity and specificity for mediastinal pathology. Atherosclerotic calcifications within the thoracic aorta.  IMPRESSION: 1.  Low lung volumes with probable bibasilar subsegmental atelectasis. 2.  Moderate cardiomegaly with pulmonary venous congestion.  No frank pulmonary edema at this time. 3.  Atherosclerosis.  Original Report Authenticated By: Etheleen Mayhew, M.D.     1. Fever   2. UTI (lower urinary tract infection)   3. Cellulitis   4. Atrial fibrillation   5. CHF (congestive heart failure)   6. CKD (chronic kidney disease)       MDM  Patient seen and evaluated. Patient in no acute distress. Patient with slight confusion today and fever. Will begin sepsis workup.  Patient is alert and oriented x3 upon my discussions with her. She is slightly tachypnea with rails on exam. Chest x-ray ordered. Patient also has signs concerning for possible cellulitis of left lower extremity.  Patient seen and discussed with attending physician.  Spoke with [facility] hospitalist. They will see patient and admit.    Date: 12/12/2011  Rate: 121  Rhythm: atrial fibrillation  QRS Axis: normal  Intervals: normal  ST/T Wave abnormalities: nonspecific ST/T changes  Conduction Disutrbances:none  Narrative Interpretation:   Old EKG Reviewed: No significant changes from 07/04/2010. Rate increased slightly.    Martie Lee, [place] 12/13/11 [phone]

## 2011-12-13 ENCOUNTER — Encounter (HOSPITAL_COMMUNITY): Payer: Self-pay | Admitting: Internal Medicine

## 2011-12-13 DIAGNOSIS — I4891 Unspecified atrial fibrillation: Secondary | ICD-10-CM | POA: Diagnosis present

## 2011-12-13 DIAGNOSIS — N390 Urinary tract infection, site not specified: Secondary | ICD-10-CM | POA: Diagnosis present

## 2011-12-13 DIAGNOSIS — A413 Sepsis due to Hemophilus influenzae: Secondary | ICD-10-CM

## 2011-12-13 DIAGNOSIS — L03119 Cellulitis of unspecified part of limb: Secondary | ICD-10-CM

## 2011-12-13 DIAGNOSIS — I5022 Chronic systolic (congestive) heart failure: Secondary | ICD-10-CM | POA: Diagnosis present

## 2011-12-13 DIAGNOSIS — N179 Acute kidney failure, unspecified: Secondary | ICD-10-CM

## 2011-12-13 DIAGNOSIS — L02419 Cutaneous abscess of limb, unspecified: Secondary | ICD-10-CM

## 2011-12-13 DIAGNOSIS — A419 Sepsis, unspecified organism: Secondary | ICD-10-CM | POA: Diagnosis present

## 2011-12-13 DIAGNOSIS — L0390 Cellulitis, unspecified: Secondary | ICD-10-CM | POA: Diagnosis present

## 2011-12-13 LAB — COMPREHENSIVE METABOLIC PANEL
AST: 17 U/L (ref 0–37)
BUN: 25 mg/dL — ABNORMAL HIGH (ref 6–23)
CO2: 22 mEq/L (ref 19–32)
Chloride: 102 mEq/L (ref 96–112)
Creatinine, Ser: 1.47 mg/dL — ABNORMAL HIGH (ref 0.50–1.10)
GFR calc non Af Amer: 32 mL/min — ABNORMAL LOW (ref >90)
Glucose, Bld: 142 mg/dL — ABNORMAL HIGH (ref 70–99)
Total Bilirubin: 1.1 mg/dL (ref 0.3–1.2)

## 2011-12-13 LAB — CBC
MCH: 28.9 pg (ref 26.0–34.0)
Platelets: 121 10*3/uL — ABNORMAL LOW (ref 150–400)
RBC: 3.53 MIL/uL — ABNORMAL LOW (ref 3.87–5.11)
WBC: 11.7 10*3/uL — ABNORMAL HIGH (ref 4.0–10.5)

## 2011-12-13 LAB — GLUCOSE, CAPILLARY

## 2011-12-13 LAB — PROTIME-INR: Prothrombin Time: 27.6 seconds — ABNORMAL HIGH (ref 11.6–15.2)

## 2011-12-13 MED ORDER — WARFARIN SODIUM 1 MG PO TABS
1.0000 mg | ORAL_TABLET | ORAL | Status: DC
  Administered 2011-12-17: 1 mg via ORAL
  Filled 2011-12-13: qty 1

## 2011-12-13 MED ORDER — DILTIAZEM HCL 25 MG/5ML IV SOLN
10.0000 mg | Freq: Once | INTRAVENOUS | Status: DC
  Filled 2011-12-13: qty 5

## 2011-12-13 MED ORDER — ACETAMINOPHEN 325 MG PO TABS
650.0000 mg | ORAL_TABLET | Freq: Four times a day (QID) | ORAL | Status: DC | PRN
  Administered 2011-12-13 – 2011-12-17 (×3): 650 mg via ORAL
  Filled 2011-12-13 (×3): qty 2

## 2011-12-13 MED ORDER — ONDANSETRON HCL 4 MG PO TABS: 4.0000 mg | ORAL_TABLET | Freq: Four times a day (QID) | ORAL | Status: DC | PRN

## 2011-12-13 MED ORDER — LEVOTHYROXINE SODIUM 175 MCG PO TABS
175.0000 ug | ORAL_TABLET | Freq: Every day | ORAL | Status: DC
  Administered 2011-12-13 – 2011-12-19 (×7): 175 ug via ORAL
  Filled 2011-12-13 (×7): qty 1

## 2011-12-13 MED ORDER — DOCUSATE SODIUM 100 MG PO CAPS
100.0000 mg | ORAL_CAPSULE | Freq: Two times a day (BID) | ORAL | Status: DC
  Filled 2011-12-13 (×4): qty 1

## 2011-12-13 MED ORDER — SODIUM CHLORIDE 0.9 % IV SOLN
INTRAVENOUS | Status: DC
  Administered 2011-12-13 – 2011-12-14 (×2): via INTRAVENOUS

## 2011-12-13 MED ORDER — POTASSIUM CHLORIDE CRYS ER 20 MEQ PO TBCR
40.0000 meq | EXTENDED_RELEASE_TABLET | Freq: Once | ORAL | Status: AC
  Administered 2011-12-13: 40 meq via ORAL
  Filled 2011-12-13: qty 2

## 2011-12-13 MED ORDER — MORPHINE SULFATE 2 MG/ML IJ SOLN: 2.0000 mg | INTRAMUSCULAR | Status: DC | PRN

## 2011-12-13 MED ORDER — ATORVASTATIN CALCIUM 40 MG PO TABS
40.0000 mg | ORAL_TABLET | ORAL | Status: DC
  Administered 2011-12-13 – 2011-12-19 (×4): 40 mg via ORAL
  Filled 2011-12-13 (×4): qty 1

## 2011-12-13 MED ORDER — VANCOMYCIN HCL 1000 MG IV SOLR
1500.0000 mg | Freq: Once | INTRAVENOUS | Status: AC
  Administered 2011-12-13: 1500 mg via INTRAVENOUS
  Filled 2011-12-13: qty 1500

## 2011-12-13 MED ORDER — SODIUM CHLORIDE 0.9 % IV BOLUS (SEPSIS)
1000.0000 mL | Freq: Once | INTRAVENOUS | Status: AC
  Administered 2011-12-13: 1000 mL via INTRAVENOUS

## 2011-12-13 MED ORDER — SENNA 8.6 MG PO TABS
1.0000 | ORAL_TABLET | Freq: Two times a day (BID) | ORAL | Status: DC
  Administered 2011-12-13: 8.6 mg via ORAL
  Filled 2011-12-13 (×3): qty 1

## 2011-12-13 MED ORDER — DEXTROSE 5 % IV SOLN
1.0000 g | Freq: Every day | INTRAVENOUS | Status: DC
  Administered 2011-12-13 – 2011-12-18 (×6): 1 g via INTRAVENOUS
  Filled 2011-12-13 (×8): qty 10

## 2011-12-13 MED ORDER — BIOTENE DRY MOUTH MT LIQD
15.0000 mL | Freq: Two times a day (BID) | OROMUCOSAL | Status: DC
  Administered 2011-12-13 – 2011-12-19 (×12): 15 mL via OROMUCOSAL

## 2011-12-13 MED ORDER — ONDANSETRON HCL 4 MG/2ML IJ SOLN: 4.0000 mg | Freq: Four times a day (QID) | INTRAMUSCULAR | Status: DC | PRN

## 2011-12-13 MED ORDER — WARFARIN SODIUM 2 MG PO TABS
2.0000 mg | ORAL_TABLET | ORAL | Status: DC
  Administered 2011-12-14 – 2011-12-18 (×4): 2 mg via ORAL
  Filled 2011-12-13 (×5): qty 1

## 2011-12-13 MED ORDER — ACETAMINOPHEN 650 MG RE SUPP: 650.0000 mg | Freq: Four times a day (QID) | RECTAL | Status: DC | PRN

## 2011-12-13 MED ORDER — ASPIRIN 81 MG PO CHEW
81.0000 mg | CHEWABLE_TABLET | Freq: Every day | ORAL | Status: DC
  Administered 2011-12-13 – 2011-12-19 (×7): 81 mg via ORAL
  Filled 2011-12-13 (×7): qty 1

## 2011-12-13 MED ORDER — WARFARIN - PHARMACIST DOSING INPATIENT: Freq: Every day | Status: DC

## 2011-12-13 MED ORDER — VANCOMYCIN HCL 1000 MG IV SOLR
1500.0000 mg | Freq: Every day | INTRAVENOUS | Status: DC
  Administered 2011-12-13 – 2011-12-14 (×2): 1500 mg via INTRAVENOUS
  Filled 2011-12-13 (×3): qty 1500

## 2011-12-13 MED ORDER — OXYBUTYNIN CHLORIDE 5 MG PO TABS
5.0000 mg | ORAL_TABLET | Freq: Three times a day (TID) | ORAL | Status: DC
  Administered 2011-12-13 – 2011-12-19 (×19): 5 mg via ORAL
  Filled 2011-12-13 (×21): qty 1

## 2011-12-13 NOTE — Progress Notes (Signed)
Patient admitted early this AM by Dr. Quay Burow- Please see h&P, HR lower with out cardizem, getting IVF, abx for sepsis- follow cultures DNR/DNI Continue to follow in SDU for now Eulogio Bear

## 2011-12-13 NOTE — ED Provider Notes (Signed)
Medical screening examination/treatment/procedure(s) were conducted as a shared visit with non-physician practitioner(s) and myself.  I personally evaluated the patient during the encounter  AMS, fever. LLE cellulitis by exam. Possible UTI. Afib RVR (may be precipitated by fever, hold bb, ccb for now). Plans for admission IV abx and monitoring.  BP 93/59  Pulse 96  Temp 98.9 F (37.2 C) (Oral)  Resp 29  Ht 5\' 7"  (1.702 m)  Wt 225 lb 15.5 oz (102.5 kg)  BMI 35.39 kg/m2  SpO2 99%   Blair Heys, MD 12/13/11 1630

## 2011-12-13 NOTE — Progress Notes (Signed)
ANTIBIOTIC CONSULT NOTE - INITIAL  Pharmacy Consult for vancomycin Indication: rule out sepsis  Allergies  Allergen Reactions  . Avelox (Moxifloxacin Hcl In Nacl)     Patient Measurements: Height: 5\' 7"  (170.2 cm) Weight: 225 lb 15.5 oz (102.5 kg) IBW/kg (Calculated) : 61.6  Adjusted Body Weight:   Vital Signs: Temp: 98.3 F (36.8 C) (06/15 0330) Temp src: Oral (06/15 0330) BP: 85/56 mmHg (06/15 0500) Pulse Rate: 99  (06/15 0500) Intake/Output from previous day: 06/14 0701 - 06/15 0700 In: 4000 [I.V.:3000; IV Piggyback:1000] Out: 400 [Urine:400] Intake/Output from this shift: Total I/O In: 4000 [I.V.:3000; IV Piggyback:1000] Out: 400 [Urine:400]  Labs:  [facility] 12/13/11 0331 12/12/11 2106  WBC 11.7* 14.6*  HGB 10.2* 11.7*  PLT 121* 156  LABCREA -- --  CREATININE 1.47* 1.52*   Estimated Creatinine Clearance: 37.6 ml/min (by C-G formula based on Cr of 1.47). No results found for this basename: VANCOTROUGH:2,VANCOPEAK:2,VANCORANDOM:2,GENTTROUGH:2,GENTPEAK:2,GENTRANDOM:2,TOBRATROUGH:2,TOBRAPEAK:2,TOBRARND:2,AMIKACINPEAK:2,AMIKACINTROU:2,AMIKACIN:2, in the last 72 hours   Microbiology: Recent Results (from the past 720 hour(s))  MRSA PCR SCREENING     Status: Normal   Collection Time   12/13/11  2:01 AM      Component Value Range Status Comment   MRSA by PCR NEGATIVE  NEGATIVE Final     Medical History: Past Medical History  Diagnosis Date  . Hypertension   . Atrial fibrillation   . CHF (congestive heart failure)     EF 20-25% and severe MR   . CAD (coronary artery disease) 06/2010 cath    Occluded distal RCA, moderately diffuse LCx and LAD disease out of proportion to LV dysfxn, and significant MR  . Torsades de pointes     Moxifloxacin induced   . Venous insufficiency     Chronic LE edema  . Hyperlipidemia   . Hypothyroidism   . CVA (cerebral infarction)     Left hemiparesis  . CKD (chronic kidney disease)     Baseline Cr 1.1-1.2     Medications:    Anti-infectives     Start     Dose/Rate Route Frequency Ordered Stop   12/13/11 2200   cefTRIAXone (ROCEPHIN) 1 g in dextrose 5 % 50 mL IVPB        1 g 100 mL/hr over 30 Minutes Intravenous Daily at bedtime 12/13/11 0224     12/13/11 2200   vancomycin (VANCOCIN) 1,500 mg in sodium chloride 0.9 % 500 mL IVPB        1,500 mg 250 mL/hr over 120 Minutes Intravenous Daily at bedtime 12/13/11 0546     12/13/11 0245   vancomycin (VANCOCIN) 1,500 mg in sodium chloride 0.9 % 500 mL IVPB        1,500 mg 250 mL/hr over 120 Minutes Intravenous  Once 12/13/11 0240 12/13/11 0501   12/12/11 2330   cefTRIAXone (ROCEPHIN) 1 g in dextrose 5 % 50 mL IVPB        1 g 100 mL/hr over 30 Minutes Intravenous  Once 12/12/11 2316 12/13/11 0025         Assessment: Patient with r/o sepsis.    Goal of Therapy:  Vancomycin trough level 15-20 mcg/ml  Plan:  Measure antibiotic drug levels at steady state Follow up culture results vancomycin 1500mg  iv x1, then q24hr  Tyler Deis, Shea Stakes [place] 12/13/2011,5:47 AM

## 2011-12-13 NOTE — H&P (Signed)
PCP:  Vena Austria, MD  Cardiologist: Dr. Tollie Eth   Chief Complaint:  Altered behavior  HPI: 27yoF with h/o moxifloxacin-induced Torsade de pointes, mixed ischemic  and non-ischemic cardiomyopathy, AFib/coumadin HTN, HL, hypothyroidism,  h/o CVA 1987, chronice LE edema presents with SIRS/sepsis, fever,  leukocytosis, UTI, possible LLE cellulitis, and mild AKI.   Pt is alert and conversant but daughter/son in law provide history, state  she has been in usual state of health until just noon today when she  began "acting funny," and confused. No reported fevers prior to  admission, no chest pain, SOB, GI symptoms, cough, dysuria, or really any  other symptoms, just subtle behavior changes.   In the ED, pt was febrile, and tachy with AFib/RVR. BP was preserved but  then dropped to SBP 60's. Labs with minimal hypoNa 134, renal 26/1.52  (baseline 1.1-1.2), normal LFT's, negative lactate but positive  procalcitonin 4.03. WBC 14.6, Hgb 11.6. INR 2.34. UA positive for  infection and Hgb, but only 0-2 RBC's. BCx x2 and UCx pending. CXR with  low lung volumes, likely atelectasis, moderate cardiomegaly and venous  congestion without edema. Pt was given tyolenol and ceftriaxone. Getting  IVF's   ROS as above, when asked about her LLE skin chnages, duaghter states the  large bright red plaque on her anterior shin is chronic, always looks  that way, but the pink macule going superiorly is new and was actually  brighter than now. She has limited mobility.     Past Medical History  Diagnosis Date  . Hypertension   . Atrial fibrillation   . CHF (congestive heart failure)     EF 20-25% and severe MR   . CAD (coronary artery disease) 06/2010 cath    Occluded distal RCA, moderately diffuse LCx and LAD disease out of proportion to LV dysfxn, and significant MR  . Torsades de pointes     Moxifloxacin induced   . Venous insufficiency     Chronic LE edema  . Hyperlipidemia     . Hypothyroidism   . CVA (cerebral infarction)     Left hemiparesis  . CKD (chronic kidney disease)     Baseline Cr 1.1-1.2     Past Surgical History  Procedure Date  . Cholecystectomy     Medications:  HOME MEDS: Prior to Admission medications   Medication Sig Start Date End Date Taking? Authorizing Provider  aspirin 81 MG chewable tablet Chew 81 mg by mouth daily.   Yes Historical Provider, MD  atorvastatin (LIPITOR) 80 MG tablet Take 40 mg by mouth every other day.    Yes Historical Provider, MD  carvedilol (COREG) 25 MG tablet Take 25 mg by mouth 2 (two) times daily with a meal.   Yes Historical Provider, MD  furosemide (LASIX) 40 MG tablet Take 40 mg by mouth daily.   Yes Historical Provider, MD  levothyroxine (SYNTHROID, LEVOTHROID) 175 MCG tablet Take 175 mcg by mouth daily.   Yes Historical Provider, MD  losartan (COZAAR) 100 MG tablet Take 100 mg by mouth daily.   Yes Historical Provider, MD  oxybutynin (DITROPAN) 5 MG tablet Take 5 mg by mouth 3 (three) times daily. Bladder spasms   Yes Historical Provider, MD  potassium chloride SA (K-DUR,KLOR-CON) 20 MEQ tablet Take 20 mEq by mouth daily.   Yes Historical Provider, MD  warfarin (COUMADIN) 2 MG tablet Take 1-2 mg by mouth See admin instructions. Wednesdays and saturdays take 1 mg. All other days take 2mg .   Yes  Historical Provider, MD    Allergies:  Allergies  Allergen Reactions  . Avelox (Moxifloxacin Hcl In Nacl)     Social History:   does not have a smoking history on file. She does not have any smokeless tobacco history on file. She reports that she does not drink alcohol or use illicit drugs.  She lives at home alone and has limited mobility with a walker, but still does get around a little. Her daughter works right next to her, so she is checked on several times a day. She quit smoking 20-30 yr ago at the time of the stroke.   Family History: History reviewed. No pertinent family history.  Physical  Exam: Filed Vitals:   12/12/11 2134 12/12/11 2136 12/12/11 2201 12/12/11 2331  BP: 149/127 118/75 131/78 61/50  Pulse: 111 111  125  Temp:    97.9 F (36.6 C)  TempSrc:    Oral  Resp: 24 28 27 25   Weight:      SpO2: 97% 97%  98%   Blood pressure 61/50, pulse 125, temperature 97.9 F (36.6 C), temperature source Oral, resp. rate 25, weight 99.791 kg (220 lb), SpO2 98.00%. Gen: Elderly, large F, pleasant, conversant, doesn't appear toxic or even  that ill at present, actually quite stable looking. Daughter provides  most of the history HEENT: Pupils round, reactive, equal, irsises/sclera clear. Mouth moist  and normal overall. Distal right tongue with a bruise, likely bit her  tongue.  Lungs: CTAB no w/c/r, good air movement, fairly normal exam overall, no  cough, no increased WOB Heart: Very hard to hear S1/2 likely due to habitus, but no gross m/g  heard Abd: Obese, soft, not rigid, not tender, no grimacing, overall benign  exam Extrem: Increased bulkiness but no muscle bulk, normal tone. BLE  hyperpigmented venous stasis changes, but minimal frank soft pitting  edema noted. On the LLE there is an anterior scaly erythematous dark  plaque like rash that is chronic, with a more faint, poorly defined, warm  pinkish macule rising from this. The LLE seems to be larger than the  right Neuro: Alert, conversant, able to answer questions, but not extensively.  Able to sit up in bed on her own, grossly moves extremities on her own.  Left side subtley diffusely weaker than right.    Labs & Imaging Results for orders placed during the hospital encounter of 12/12/11 (from the past 48 hour(s))  CBC     Status: Abnormal   Collection Time   12/12/11  9:06 PM      Component Value Range Comment   WBC 14.6 (*) 4.0 - 10.5 K/uL    RBC 4.01  3.87 - 5.11 MIL/uL    Hemoglobin 11.7 (*) 12.0 - 15.0 g/dL    HCT 35.1 (*) 36.0 - 46.0 %    MCV 87.5  78.0 - 100.0 fL    MCH 29.2  26.0 - 34.0 pg     MCHC 33.3  30.0 - 36.0 g/dL    RDW 14.5  11.5 - 15.5 %    Platelets 156  150 - 400 K/uL   DIFFERENTIAL     Status: Abnormal   Collection Time   12/12/11  9:06 PM      Component Value Range Comment   Neutrophils Relative 89 (*) 43 - 77 %    Neutro Abs 13.1 (*) 1.7 - 7.7 K/uL    Lymphocytes Relative 6 (*) 12 - 46 %    Lymphs Abs 0.8  0.7 - 4.0 K/uL    Monocytes Relative 5  3 - 12 %    Monocytes Absolute 0.7  0.1 - 1.0 K/uL    Eosinophils Relative 0  0 - 5 %    Eosinophils Absolute 0.0  0.0 - 0.7 K/uL    Basophils Relative 0  0 - 1 %    Basophils Absolute 0.0  0.0 - 0.1 K/uL   COMPREHENSIVE METABOLIC PANEL     Status: Abnormal   Collection Time   12/12/11  9:06 PM      Component Value Range Comment   Sodium 134 (*) 135 - 145 mEq/L    Potassium 3.5  3.5 - 5.1 mEq/L    Chloride 99  96 - 112 mEq/L    CO2 23  19 - 32 mEq/L    Glucose, Bld 157 (*) 70 - 99 mg/dL    BUN 26 (*) 6 - 23 mg/dL    Creatinine, Ser 1.52 (*) 0.50 - 1.10 mg/dL    Calcium 9.4  8.4 - 10.5 mg/dL    Total Protein 7.1  6.0 - 8.3 g/dL    Albumin 3.4 (*) 3.5 - 5.2 g/dL    AST 18  0 - 37 U/L    ALT 13  0 - 35 U/L    Alkaline Phosphatase 70  39 - 117 U/L    Total Bilirubin 1.1  0.3 - 1.2 mg/dL    GFR calc non Af Amer 31 (*) >90 mL/min    GFR calc Af Amer 36 (*) >90 mL/min   PROTIME-INR     Status: Abnormal   Collection Time   12/12/11  9:06 PM      Component Value Range Comment   Prothrombin Time 26.0 (*) 11.6 - 15.2 seconds    INR 2.34 (*) 0.00 - 1.49   LACTIC ACID, PLASMA     Status: Normal   Collection Time   12/12/11 10:00 PM      Component Value Range Comment   Lactic Acid, Venous 1.7  0.5 - 2.2 mmol/L   PROCALCITONIN     Status: Normal   Collection Time   12/12/11 10:00 PM      Component Value Range Comment   Procalcitonin 4.03     URINALYSIS, ROUTINE W REFLEX MICROSCOPIC     Status: Abnormal   Collection Time   12/12/11 10:21 PM      Component Value Range Comment   Color, Urine YELLOW  YELLOW     APPearance CLOUDY (*) CLEAR    Specific Gravity, Urine 1.018  1.005 - 1.030    pH 5.5  5.0 - 8.0    Glucose, UA NEGATIVE  NEGATIVE mg/dL    Hgb urine dipstick LARGE (*) NEGATIVE    Bilirubin Urine NEGATIVE  NEGATIVE    Ketones, ur NEGATIVE  NEGATIVE mg/dL    Protein, ur NEGATIVE  NEGATIVE mg/dL    Urobilinogen, UA 0.2  0.0 - 1.0 mg/dL    Nitrite POSITIVE (*) NEGATIVE    Leukocytes, UA LARGE (*) NEGATIVE   URINE MICROSCOPIC-ADD ON     Status: Abnormal   Collection Time   12/12/11 10:21 PM      Component Value Range Comment   Squamous Epithelial / LPF FEW (*) RARE    WBC, UA 21-50  <3 WBC/hpf WITH CLUMPS   RBC / HPF 0-2  <3 RBC/hpf    Bacteria, UA MANY (*) RARE    Dg Chest 2 View  12/12/2011  *RADIOLOGY  REPORT*  Clinical Data: Shortness of breath, cough and fever.  CHEST - 2 VIEW  Comparison: Chest x-ray 06/30/2010.  Findings: Lung volumes are low.  There is pulmonary venous congestion without frank pulmonary edema.  Bibasilar opacities are favored to represent areas of subsegmental atelectasis.  No definite pleural effusions.  Heart size is moderately enlarged (unchanged). The patient is rotated to the right on today's exam, resulting in distortion of the mediastinal contours and reduced diagnostic sensitivity and specificity for mediastinal pathology. Atherosclerotic calcifications within the thoracic aorta.  IMPRESSION: 1.  Low lung volumes with probable bibasilar subsegmental atelectasis. 2.  Moderate cardiomegaly with pulmonary venous congestion.  No frank pulmonary edema at this time. 3.  Atherosclerosis.  Original Report Authenticated By: Etheleen Mayhew, M.D.     ECG: Afib 121 bpm, likely normal axis, narrow QRS, likely not Q wave in  III but with ditzel R wave, early RWP in V2, no frank ST deviations,  diffusely flat inferior and lateral TWF. No prior.    Impression Present on Admission:  .Atrial fibrillation .CHF (congestive heart failure) .Sepsis .UTI (lower urinary  tract infection) .ARF (acute renal failure) .Cellulitis  82yoF with h/o moxifloxacin-induced Torsade de pointes, mixed ischemic  and non-ischemic cardiomyopathy, AFib/coumadin HTN, HL, hypothyroidism,  h/o CVA 1987, chronice LE edema presents with SIRS/sepsis, fever,  leukocytosis, UTI, possible LLE cellulitis, and mild AKI.   1. SIRS/sepsis: Fevers, luekocytosis. BP was stable until I walked into  the room, and dropped to SBP 60's with HR 120-130's, concern for sepsis  or AFib with RVR leading to poor preload. Infectious source likely UTI  and/or small amount of erythema on LLE, not overwhelming though (the dark  prominent erythematous rash is chronic, the rising faint pink is new).  Has end organ damage in AKI on CKD, see below.   Of note SBP improved to 80's with 500 cc bolus, appears to be fluid  responsive.   - Admit SDU - Aggressive IVF's to maintain MAP >60 and if still tachy with RVR and  have BP room, will attempt Dilt boluses  - Ceftriaxone for UTI, will add Vanc empirically for sepsis, ? cellulitis  2. AFib with minimal RVR: As above, will bolus NS until pressure allow  nodal agents to control rate, if still needed.  - Diltiazem boluses PRN HR > 120 and if SBP >100  - Continue coumadin, daily INR's.   3. ARF on CKD: [place] Cr appears to be 1.1-1.2, now 1.5. Suspect pre- renal vs sepsis.  - IVF's and trending BMET.  - Get CK level given UA with Hgb but no RBC's.   4. CAD, h/o CHF: Does not look volume overloaded at present, and benefit  of IVF's > risks at this point. No chest pain, ECG not ischemic.  - Continue ASA 81, statin, holding coreg, lasix, cozaar  5. Conitnue oxybuynin, thryoid med   Coumadin DVT prophy SDU, [facility] team 1 DNR, DNI, discussed wtih pt, daughter, son in law   [place], [place] 12/13/2011, 12:52 AM

## 2011-12-13 NOTE — ED Notes (Signed)
Pt alert and oriented x 4. Family at bedside.

## 2011-12-13 NOTE — Progress Notes (Signed)
ANTICOAGULATION CONSULT NOTE - Initial Consult  Pharmacy Consult for warfarin Indication: atrial fibrillation  Allergies  Allergen Reactions  . Avelox (Moxifloxacin Hcl In Nacl)     Patient Measurements: Height: 5\' 7"  (170.2 cm) Weight: 225 lb 15.5 oz (102.5 kg) IBW/kg (Calculated) : 61.6  Heparin Dosing Weight:   Vital Signs: Temp: 98.3 F (36.8 C) (06/15 0330) Temp src: Oral (06/15 0330) BP: 85/56 mmHg (06/15 0500) Pulse Rate: 99  (06/15 0500)  Labs:  Basename 12/13/11 0331 12/12/11 2106  HGB 10.2* 11.7*  HCT 31.0* 35.1*  PLT 121* 156  APTT -- --  LABPROT 27.6* 26.0*  INR 2.52* 2.34*  HEPARINUNFRC -- --  CREATININE 1.47* 1.52*  CKTOTAL 279* --  CKMB -- --  TROPONINI -- --    Estimated Creatinine Clearance: 37.6 ml/min (by C-G formula based on Cr of 1.47).   Medical History: Past Medical History  Diagnosis Date  . Hypertension   . Atrial fibrillation   . CHF (congestive heart failure)     EF 20-25% and severe MR   . CAD (coronary artery disease) 06/2010 cath    Occluded distal RCA, moderately diffuse LCx and LAD disease out of proportion to LV dysfxn, and significant MR  . Torsades de pointes     Moxifloxacin induced   . Venous insufficiency     Chronic LE edema  . Hyperlipidemia   . Hypothyroidism   . CVA (cerebral infarction)     Left hemiparesis  . CKD (chronic kidney disease)     Baseline Cr 1.1-1.2     Medications:  Prescriptions prior to admission  Medication Sig Dispense Refill  . aspirin 81 MG chewable tablet Chew 81 mg by mouth daily.      Marland Kitchen atorvastatin (LIPITOR) 80 MG tablet Take 40 mg by mouth every other day.       . carvedilol (COREG) 25 MG tablet Take 25 mg by mouth 2 (two) times daily with a meal.      . furosemide (LASIX) 40 MG tablet Take 40 mg by mouth daily.      Marland Kitchen levothyroxine (SYNTHROID, LEVOTHROID) 175 MCG tablet Take 175 mcg by mouth daily.      Marland Kitchen losartan (COZAAR) 100 MG tablet Take 100 mg by mouth daily.      Marland Kitchen  oxybutynin (DITROPAN) 5 MG tablet Take 5 mg by mouth 3 (three) times daily. Bladder spasms      . potassium chloride SA (K-DUR,KLOR-CON) 20 MEQ tablet Take 20 mEq by mouth daily.      Marland Kitchen warfarin (COUMADIN) 2 MG tablet Take 1-2 mg by mouth See admin instructions. Wednesdays and saturdays take 1 mg. All other days take 2mg .        Assessment: Patient on chronic warfarin for Afib.  INR at goal.    Goal of Therapy:  INR 2-3    Plan:  Continue home warfarin dose. Daily INR  Nani Skillern Crowford 12/13/2011,5:48 AM

## 2011-12-14 DIAGNOSIS — N179 Acute kidney failure, unspecified: Secondary | ICD-10-CM

## 2011-12-14 DIAGNOSIS — A413 Sepsis due to Hemophilus influenzae: Secondary | ICD-10-CM

## 2011-12-14 DIAGNOSIS — I4891 Unspecified atrial fibrillation: Secondary | ICD-10-CM

## 2011-12-14 DIAGNOSIS — L03119 Cellulitis of unspecified part of limb: Secondary | ICD-10-CM

## 2011-12-14 DIAGNOSIS — L02419 Cutaneous abscess of limb, unspecified: Secondary | ICD-10-CM

## 2011-12-14 DIAGNOSIS — E876 Hypokalemia: Secondary | ICD-10-CM | POA: Diagnosis present

## 2011-12-14 LAB — BASIC METABOLIC PANEL
BUN: 18 mg/dL (ref 6–23)
CO2: 20 mEq/L (ref 19–32)
Chloride: 109 mEq/L (ref 96–112)
Creatinine, Ser: 1.12 mg/dL — ABNORMAL HIGH (ref 0.50–1.10)
Glucose, Bld: 125 mg/dL — ABNORMAL HIGH (ref 70–99)

## 2011-12-14 LAB — CBC
HCT: 29.5 % — ABNORMAL LOW (ref 36.0–46.0)
MCV: 89.7 fL (ref 78.0–100.0)
RDW: 15.1 % (ref 11.5–15.5)
WBC: 6.4 10*3/uL (ref 4.0–10.5)

## 2011-12-14 MED ORDER — POTASSIUM CHLORIDE CRYS ER 20 MEQ PO TBCR
40.0000 meq | EXTENDED_RELEASE_TABLET | Freq: Once | ORAL | Status: AC
  Administered 2011-12-14: 40 meq via ORAL
  Filled 2011-12-14: qty 2

## 2011-12-14 MED ORDER — CARVEDILOL 25 MG PO TABS
25.0000 mg | ORAL_TABLET | Freq: Two times a day (BID) | ORAL | Status: DC
  Administered 2011-12-14 – 2011-12-15 (×3): 25 mg via ORAL
  Filled 2011-12-14 (×5): qty 1

## 2011-12-14 NOTE — Progress Notes (Addendum)
Subjective: Wanting to go home Stating she feels much better No CP No SOB  Objective: Vital signs in last 24 hours: Filed Vitals:   12/14/11 0400 12/14/11 0446 12/14/11 0800 12/14/11 0825  BP:  116/77  138/79  Pulse:    105  Temp: 98.5 F (36.9 C)  99.6 F (37.6 C)   TempSrc: Oral  Oral   Resp: 21   22  Height:      Weight: 106.5 kg (234 lb 12.6 oz)     SpO2: 99%   99%   Weight change: 6.709 kg (14 lb 12.6 oz)  Intake/Output Summary (Last 24 hours) at 12/14/11 0925 Last data filed at 12/14/11 0800  Gross per 24 hour  Intake   3005 ml  Output   1150 ml  Net   1855 ml    Physical Exam: General: Awake, Oriented, No acute distress. HEENT: EOMI. Neck: Supple CV: S1 and S2, tachy Lungs: Clear to ascultation bilaterally, no wheezing Abdomen: Soft, Nontender, Nondistended, +bowel sounds. Ext: Good pulses. + edema, left leg with more swelling and some warmth and redness   Lab Results:  [facility] 12/14/11 0347 12/13/11 0331  NA 138 135  K 3.4* 3.2*  CL 109 102  CO2 20 22  GLUCOSE 125* 142*  BUN 18 25*  CREATININE 1.12* 1.47*  CALCIUM 8.6 8.6  MG -- --  PHOS -- --    Basename 12/13/11 0331 12/12/11 2106  AST 17 18  ALT 12 13  ALKPHOS 60 70  BILITOT 1.1 1.1  PROT 6.0 7.1  ALBUMIN 2.8* 3.4*   No results found for this basename: LIPASE:2,AMYLASE:2 in the last 72 hours  Basename 12/14/11 0347 12/13/11 0331 12/12/11 2106  WBC 6.4 11.7* --  NEUTROABS -- -- 13.1*  HGB 9.6* 10.2* --  HCT 29.5* 31.0* --  MCV 89.7 87.8 --  PLT 120* 121* --    Basename 12/13/11 0331  CKTOTAL 279*  CKMB --  CKMBINDEX --  TROPONINI --   No components found with this basename: POCBNP:3 No results found for this basename: DDIMER:2 in the last 72 hours No results found for this basename: HGBA1C:2 in the last 72 hours No results found for this basename: CHOL:2,HDL:2,LDLCALC:2,TRIG:2,CHOLHDL:2,LDLDIRECT:2 in the last 72 hours No results found for this basename:  TSH,T4TOTAL,FREET3,T3FREE,THYROIDAB in the last 72 hours No results found for this basename: VITAMINB12:2,FOLATE:2,FERRITIN:2,TIBC:2,IRON:2,RETICCTPCT:2 in the last 72 hours  Micro Results: Recent Results (from the past 240 hour(s))  URINE CULTURE     Status: Normal (Preliminary result)   Collection Time   12/12/11 10:21 PM      Component Value Range Status Comment   Specimen Description URINE, CATHETERIZED   Final    Special Requests NONE   Final    Culture  Setup Time [id number]   Final    Colony Count >=100,000 COLONIES/ML   Final    Culture GRAM NEGATIVE RODS   Final    Report Status PENDING   Incomplete   MRSA PCR SCREENING     Status: Normal   Collection Time   12/13/11  2:01 AM      Component Value Range Status Comment   MRSA by PCR NEGATIVE  NEGATIVE Final     Studies/Results: Dg Chest 2 View  12/12/2011  *RADIOLOGY REPORT*  Clinical Data: Shortness of breath, cough and fever.  CHEST - 2 VIEW  Comparison: Chest x-ray 06/30/2010.  Findings: Lung volumes are low.  There is pulmonary venous congestion without frank pulmonary edema.  Bibasilar  opacities are favored to represent areas of subsegmental atelectasis.  No definite pleural effusions.  Heart size is moderately enlarged (unchanged). The patient is rotated to the right on today's exam, resulting in distortion of the mediastinal contours and reduced diagnostic sensitivity and specificity for mediastinal pathology. Atherosclerotic calcifications within the thoracic aorta.  IMPRESSION: 1.  Low lung volumes with probable bibasilar subsegmental atelectasis. 2.  Moderate cardiomegaly with pulmonary venous congestion.  No frank pulmonary edema at this time. 3.  Atherosclerosis.  Original Report Authenticated By: Etheleen Mayhew, M.D.    Medications: I have reviewed the patient's current medications. Scheduled Meds:   . antiseptic oral rinse  15 mL Mouth Rinse BID  . aspirin  81 mg Oral Daily  . atorvastatin  40 mg Oral QODAY    . carvedilol  25 mg Oral BID WC  . cefTRIAXone (ROCEPHIN)  IV  1 g Intravenous QHS  . diltiazem  10 mg Intravenous Once  . docusate sodium  100 mg Oral BID  . levothyroxine  175 mcg Oral Daily  . oxybutynin  5 mg Oral TID  . potassium chloride  40 mEq Oral Once  . potassium chloride  40 mEq Oral Once  . senna  1 tablet Oral BID  . vancomycin  1,500 mg Intravenous QHS  . warfarin  1 mg Oral Custom  . warfarin  2 mg Oral Custom  . Warfarin - Pharmacist Dosing Inpatient   Does not apply q1800   Continuous Infusions:   . sodium chloride 50 mL/hr at 12/14/11 0533   PRN Meds:.acetaminophen, acetaminophen, morphine injection, ondansetron (ZOFRAN) IV, ondansetron  Assessment/Plan:   *Sepsis- BP responded to IVF- abx given, improvement in vital signs   Atrial fibrillation- restart coreg, coumadin, seems to be in sinus rhythm   CHF (congestive heart failure)- restart lasix tomm AM   UTI (lower urinary tract infection)- await culture, abx continue   ARF (acute renal failure)- improved with IVF   Cellulitis- abx, continue to monitor  Hypokalemia- repleat  tx out SDU PT   LOS: 2 days  Shaunee Mulkern, DO 12/14/2011, 9:25 AM

## 2011-12-14 NOTE — Progress Notes (Signed)
ANTICOAGULATION CONSULT NOTE - Follow Up Consult  Pharmacy Consult for Warfarin Indication: atrial fibrillation  Allergies  Allergen Reactions  . Avelox (Moxifloxacin Hcl In Nacl)     Patient Measurements: Height: 5\' 7"  (170.2 cm) Weight: 234 lb 12.6 oz (106.5 kg) IBW/kg (Calculated) : 61.6   Vital Signs: Temp: 99.6 F (37.6 C) (06/16 0800) Temp src: Oral (06/16 0800) BP: 138/79 mmHg (06/16 0825) Pulse Rate: 105  (06/16 0825)  Labs:  Basename 12/14/11 0347 12/13/11 0331 12/12/11 2106  HGB 9.6* 10.2* --  HCT 29.5* 31.0* 35.1*  PLT 120* 121* 156  APTT -- -- --  LABPROT 28.0* 27.6* 26.0*  INR 2.57* 2.52* 2.34*  HEPARINUNFRC -- -- --  CREATININE 1.12* 1.47* 1.52*  CKTOTAL -- 279* --  CKMB -- -- --  TROPONINI -- -- --    Estimated Creatinine Clearance: 50.3 ml/min (by C-G formula based on Cr of 1.12).   Assessment:  40 YOF on chronic coumadin PTA for atrial fibrillation.  Home dose reported as 2 mg daily except takes 1 mg on Wednesdays and Saturdays.  INR therapeutic on admission.  Unclear if patient received warfarin dose last night.  Will continue home dosing.  CBC stable.  No bleeding/complications reported.  Goal of Therapy:  INR 2-3  Plan:  Continue home dosing schedule. Daily INR.  Hershal Coria 12/14/2011,9:30 AM

## 2011-12-15 ENCOUNTER — Encounter (HOSPITAL_COMMUNITY): Payer: Self-pay | Admitting: Internal Medicine

## 2011-12-15 DIAGNOSIS — L03119 Cellulitis of unspecified part of limb: Secondary | ICD-10-CM

## 2011-12-15 DIAGNOSIS — N179 Acute kidney failure, unspecified: Secondary | ICD-10-CM

## 2011-12-15 DIAGNOSIS — I4891 Unspecified atrial fibrillation: Secondary | ICD-10-CM

## 2011-12-15 DIAGNOSIS — L02419 Cutaneous abscess of limb, unspecified: Secondary | ICD-10-CM

## 2011-12-15 DIAGNOSIS — A413 Sepsis due to Hemophilus influenzae: Secondary | ICD-10-CM

## 2011-12-15 LAB — URINE CULTURE

## 2011-12-15 LAB — VANCOMYCIN, TROUGH: Vancomycin Tr: 13.8 ug/mL (ref 10.0–20.0)

## 2011-12-15 LAB — GLUCOSE, CAPILLARY: Glucose-Capillary: 102 mg/dL — ABNORMAL HIGH (ref 70–99)

## 2011-12-15 LAB — CBC
Hemoglobin: 9.7 g/dL — ABNORMAL LOW (ref 12.0–15.0)
RBC: 3.38 MIL/uL — ABNORMAL LOW (ref 3.87–5.11)
WBC: 5.4 10*3/uL (ref 4.0–10.5)

## 2011-12-15 LAB — BASIC METABOLIC PANEL
CO2: 21 mEq/L (ref 19–32)
Glucose, Bld: 115 mg/dL — ABNORMAL HIGH (ref 70–99)
Potassium: 3.9 mEq/L (ref 3.5–5.1)
Sodium: 139 mEq/L (ref 135–145)

## 2011-12-15 LAB — PROTIME-INR
INR: 2.03 — ABNORMAL HIGH (ref 0.00–1.49)
Prothrombin Time: 23.3 seconds — ABNORMAL HIGH (ref 11.6–15.2)

## 2011-12-15 MED ORDER — POTASSIUM CHLORIDE CRYS ER 20 MEQ PO TBCR
20.0000 meq | EXTENDED_RELEASE_TABLET | Freq: Every day | ORAL | Status: DC
  Administered 2011-12-15: 20 meq via ORAL
  Filled 2011-12-15 (×2): qty 1

## 2011-12-15 MED ORDER — VANCOMYCIN HCL IN DEXTROSE 1-5 GM/200ML-% IV SOLN
1000.0000 mg | Freq: Two times a day (BID) | INTRAVENOUS | Status: DC
  Administered 2011-12-15: 1000 mg via INTRAVENOUS
  Filled 2011-12-15 (×2): qty 200

## 2011-12-15 MED ORDER — FUROSEMIDE 40 MG PO TABS
40.0000 mg | ORAL_TABLET | Freq: Every day | ORAL | Status: DC
  Administered 2011-12-15 – 2011-12-19 (×5): 40 mg via ORAL
  Filled 2011-12-15 (×5): qty 1

## 2011-12-15 MED ORDER — METOPROLOL TARTRATE 50 MG PO TABS
50.0000 mg | ORAL_TABLET | Freq: Two times a day (BID) | ORAL | Status: DC
  Administered 2011-12-15 – 2011-12-19 (×8): 50 mg via ORAL
  Filled 2011-12-15 (×9): qty 1

## 2011-12-15 NOTE — Progress Notes (Signed)
ANTIBIOTIC CONSULT NOTE   Pharmacy Consult for vancomycin Indication: rule out sepsis  Allergies  Allergen Reactions  . Avelox (Moxifloxacin Hcl In Nacl)    Patient Measurements: Height: 5\' 7"  (170.2 cm) Weight: 235 lb 7.2 oz (106.8 kg) IBW/kg (Calculated) : 61.6   Labs:  Basename 12/15/11 0420 12/14/11 0347 12/13/11 0331  WBC 5.4 6.4 11.7*  HGB 9.7* 9.6* 10.2*  PLT 122* 120* 121*  LABCREA -- -- --  CREATININE 1.05 1.12* 1.47*   Estimated Creatinine Clearance: 53.8 ml/min (by C-G formula based on Cr of 1.05).  Basename 12/15/11 2050  VANCOTROUGH 13.8  VANCOPEAK --  Jake Michaelis --  GENTTROUGH --  GENTPEAK --  GENTRANDOM --  TOBRATROUGH --  TOBRAPEAK --  TOBRARND --  AMIKACINPEAK --  AMIKACINTROU --  AMIKACIN --    Medications:  Anti-infectives     Start     Dose/Rate Route Frequency Ordered Stop   12/13/11 2200   cefTRIAXone (ROCEPHIN) 1 g in dextrose 5 % 50 mL IVPB        1 g 100 mL/hr over 30 Minutes Intravenous Daily at bedtime 12/13/11 0224     12/13/11 2200   vancomycin (VANCOCIN) 1,500 mg in sodium chloride 0.9 % 500 mL IVPB        1,500 mg 250 mL/hr over 120 Minutes Intravenous Daily at bedtime 12/13/11 0546     12/13/11 0245   vancomycin (VANCOCIN) 1,500 mg in sodium chloride 0.9 % 500 mL IVPB        1,500 mg 250 mL/hr over 120 Minutes Intravenous  Once 12/13/11 0240 12/13/11 0501   12/12/11 2330   cefTRIAXone (ROCEPHIN) 1 g in dextrose 5 % 50 mL IVPB        1 g 100 mL/hr over 30 Minutes Intravenous  Once 12/12/11 2316 12/13/11 0025         Assessment:  33 YOF presenting with s/sx's sepsis   Day #3 vancomycin, D#4 ceftriaxone  Renal function improving  Vancomycin trough (13.8) slightly below goal level  Goal of Therapy:  Vancomycin trough level 15-20 mcg/ml  Plan:  Increase to Vancomycin 1g IV q12h.  Re-check Vanc trough at steady state.  Follow up renal fxn and culture results.   Gretta Arab PharmD, BCPS Pager  [phone] 12/15/2011 10:03 PM

## 2011-12-15 NOTE — Progress Notes (Signed)
ANTICOAGULATION CONSULT NOTE - Follow Up Consult  Pharmacy Consult for Warfarin Indication: atrial fibrillation  Allergies  Allergen Reactions  . Avelox (Moxifloxacin Hcl In Nacl)     Patient Measurements: Height: 5\' 7"  (170.2 cm) Weight: 235 lb 7.2 oz (106.8 kg) IBW/kg (Calculated) : 61.6   Vital Signs: Temp: 97.7 F (36.5 C) (06/17 0458) Temp src: Oral (06/17 0458) BP: 128/84 mmHg (06/17 0848) Pulse Rate: 124  (06/17 0848)  Labs:  Basename 12/15/11 0420 12/14/11 0347 12/13/11 0331  HGB 9.7* 9.6* --  HCT 30.5* 29.5* 31.0*  PLT 122* 120* 121*  APTT -- -- --  LABPROT 23.3* 28.0* 27.6*  INR 2.03* 2.57* 2.52*  HEPARINUNFRC -- -- --  CREATININE 1.05 1.12* 1.47*  CKTOTAL -- -- 279*  CKMB -- -- --  TROPONINI -- -- --    Estimated Creatinine Clearance: 53.8 ml/min (by C-G formula based on Cr of 1.05).   Assessment:  59 YOF on chronic coumadin PTA for atrial fibrillation.  Home dose reported as 2 mg daily except takes 1 mg on Wednesdays and Saturdays.  INR therapeutic on admission.  INR decreased today.  No record of patient receiving doses on 6/14 or 6/15 and this is most likely cause of decline.  Will continue home regimen for now.  Hgb and platelets low but stable.  No bleeding/complications reported.  Goal of Therapy:  INR 2-3  Plan:  Continue home dosing schedule.  Patient will receive 2 mg dose tonight. Daily INR.  Hershal Coria 12/15/2011,10:00 AM

## 2011-12-15 NOTE — Evaluation (Signed)
Physical Therapy Evaluation Patient Details Name: [name] MRN: [id number] DOB: May 14, 1930 Today's Date: 12/15/2011 Time: [id number] PT Time Calculation (min): 37 min  PT Assessment / Plan / Recommendation Clinical Impression  Pt presents with sepsis with history of CHF, ARF, cellulitis and stroke in 1987.  Requires assist for getting to EOB and +2 for safety when ambulating.  Pt/pts grandson states that she is close to baseline mobility.  Pt will benefit from skilled PT in acute venue to address deficits.  PT recommends HHPT for follow up therapy at D/C to increase pt safety.      PT Assessment  Patient needs continued PT services    Follow Up Recommendations  Home health PT;Supervision for mobility/OOB    Barriers to Discharge Decreased caregiver support Pt states that daughter is available intermittently, however there will be one day that she will be completely alone.  Pt refused SNF.     lEquipment Recommendations  Rolling walker with 5" wheels    Recommendations for Other Services OT consult   Frequency Min 3X/week    Precautions / Restrictions Precautions Precautions: Fall Restrictions Weight Bearing Restrictions: No   Pertinent Vitals/Pain No pain      Mobility  Bed Mobility Bed Mobility: Supine to Sit;Sitting - Scoot to Edge of Bed Supine to Sit: 1: +2 Total assist Supine to Sit: Patient Percentage: 60% Sitting - Scoot to Edge of Bed: 3: Mod assist;4: Min assist Details for Bed Mobility Assistance: Assist for LLE off of bed due to weakness and swelling.  Some assist for trunk into sitting, however pt able to use rails to self assist.  Requires assist via pad to scoot to EOB. Cues for technique.  Transfers Transfers: Sit to Stand;Stand to Sit Sit to Stand: 1: +2 Total assist;From elevated surface;With upper extremity assist;From bed Sit to Stand: Patient Percentage: 60% Stand to Sit: 1: +2 Total assist;With upper extremity assist;With armrests;To  chair/3-in-1 Stand to Sit: Patient Percentage: 60% Details for Transfer Assistance: Cues for hand placement and safety. Requires assist to rise and steady once in standing.  Ambulation/Gait Ambulation/Gait Assistance: 1: +2 Total assist Ambulation/Gait: Patient Percentage: 70% Ambulation Distance (Feet): 30 Feet (then another 30') Assistive device: Rolling walker Ambulation/Gait Assistance Details: Requires assist with maintaining upright position and for RW due to slight tendency to let RW get too far ahead of her.  Cues for sequencing/technique with RW.  Gait Pattern: Step-to pattern;Decreased step length - left;Trunk flexed;Left foot flat;Lateral trunk lean to left Gait velocity: decreased Stairs: No Wheelchair Mobility Wheelchair Mobility: No    Exercises     PT Diagnosis: Difficulty walking;Abnormality of gait;Generalized weakness  PT Problem List: Decreased strength;Decreased activity tolerance;Decreased balance;Decreased mobility;Decreased knowledge of use of DME;Decreased skin integrity;Obesity;Cardiopulmonary status limiting activity PT Treatment Interventions: DME instruction;Gait training;Functional mobility training;Therapeutic activities;Therapeutic exercise;Balance training;Patient/family education   PT Goals Acute Rehab PT Goals PT Goal Formulation: With patient Time For Goal Achievement: 12/29/11 Potential to Achieve Goals: Fair Pt will go Supine/Side to Sit: with supervision PT Goal: Supine/Side to Sit - Progress: Goal set today Pt will go Sit to Supine/Side: with supervision PT Goal: Sit to Supine/Side - Progress: Goal set today Pt will go Sit to Stand: with supervision PT Goal: Sit to Stand - Progress: Goal set today Pt will go Stand to Sit: with supervision PT Goal: Stand to Sit - Progress: Goal set today Pt will Ambulate: 16 - 50 feet;with least restrictive assistive device;with supervision PT Goal: Ambulate - Progress: Goal set today  Visit Information   Last PT Received On: 12/15/11 Assistance Needed: +2 (for safety and chair follow)    Subjective Data  Subjective: I'm not dancing today Patient Stated Goal: to get back home   Prior [place] Lives With: Alone Available Help at Discharge: Family;Available PRN/intermittently Type of Home: House Home Access: Stairs to enter Home Layout: One level Bathroom Shower/Tub: Multimedia programmer: Standard Home Adaptive Equipment: Walker - rolling;Straight cane;Wheelchair - manual Prior Function Level of Independence: Independent with assistive device(s) Able to Take Stairs?: No Driving: No Vocation: Retired 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 difficulties    Cognition  Overall Cognitive Status: Appears within functional limits for tasks assessed/performed Arousal/Alertness: Awake/alert Orientation Level: Appears intact for tasks assessed Behavior During Session: [facility] for tasks performed    Extremity/Trunk Assessment Right Lower Extremity Assessment RLE ROM/Strength/Tone: [facility] for tasks assessed RLE Coordination: WFL - gross motor Left Lower Extremity Assessment LLE ROM/Strength/Tone: Deficits LLE ROM/Strength/Tone Deficits: Grossly greater than or equal to 3/5, but weaker than R LE from previous stroke.  LLE Coordination: WFL - gross motor Trunk Assessment Trunk Assessment: Kyphotic   Balance    End of Session PT - End of Session Equipment Utilized During Treatment: Gait belt Activity Tolerance: Patient limited by fatigue Patient left: in chair;with call bell/phone within reach;with family/visitor present Nurse Communication: Mobility status   Page, Betha Loa 12/15/2011, 3:54 PM

## 2011-12-15 NOTE — Progress Notes (Addendum)
Subjective: Wanting to go home Stating she feels much better No CP No SOB  Objective: Vital signs in last 24 hours: Filed Vitals:   12/15/11 0000 12/15/11 0149 12/15/11 0458 12/15/11 0848  BP:  123/87 101/55 128/84  Pulse: 119 105 109 124  Temp: 97.9 F (36.6 C) 98.1 F (36.7 C) 97.7 F (36.5 C)   TempSrc: Oral Oral Oral   Resp: 25 20 20    Height:  5\' 7"  (1.702 m)    Weight:  107 kg (235 lb 14.3 oz) 106.8 kg (235 lb 7.2 oz)   SpO2: 94% 93% 97%    Weight change: 0.5 kg (1 lb 1.6 oz)  Intake/Output Summary (Last 24 hours) at 12/15/11 0850 Last data filed at 12/15/11 0512  Gross per 24 hour  Intake    790 ml  Output    480 ml  Net    310 ml    Physical Exam: General: Awake, Oriented, No acute distress. HEENT: EOMI. Neck: Supple [id number], tachy Lungs: Clear to ascultation bilaterally, no wheezing Abdomen: Soft, Nontender, Nondistended, +bowel sounds. Ext: Good pulses. + edema, left leg with more swelling and some warmth and redness (decreased today)   Lab Results:  [facility] 12/15/11 0420 12/14/11 0347  NA 139 138  K 3.9 3.4*  CL 111 109  CO2 21 20  GLUCOSE 115* 125*  BUN 16 18  CREATININE 1.05 1.12*  CALCIUM 9.1 8.6  MG -- --  PHOS -- --    Basename 12/13/11 0331 12/12/11 2106  AST 17 18  ALT 12 13  ALKPHOS 60 70  BILITOT 1.1 1.1  PROT 6.0 7.1  ALBUMIN 2.8* 3.4*   No results found for this basename: LIPASE:2,AMYLASE:2 in the last 72 hours  Basename 12/15/11 0420 12/14/11 0347 12/12/11 2106  WBC 5.4 6.4 --  NEUTROABS -- -- 13.1*  HGB 9.7* 9.6* --  HCT 30.5* 29.5* --  MCV 90.2 89.7 --  PLT 122* 120* --    Basename 12/13/11 0331  CKTOTAL 279*  CKMB --  CKMBINDEX --  TROPONINI --   No components found with this basename: POCBNP:3 No results found for this basename: DDIMER:2 in the last 72 hours No results found for this basename: HGBA1C:2 in the last 72 hours No results found for this basename:  CHOL:2,HDL:2,LDLCALC:2,TRIG:2,CHOLHDL:2,LDLDIRECT:2 in the last 72 hours No results found for this basename: TSH,T4TOTAL,FREET3,T3FREE,THYROIDAB in the last 72 hours No results found for this basename: VITAMINB12:2,FOLATE:2,FERRITIN:2,TIBC:2,IRON:2,RETICCTPCT:2 in the last 72 hours  Micro Results: Recent Results (from the past 240 hour(s))  CULTURE, BLOOD (ROUTINE X 2)     Status: Normal (Preliminary result)   Collection Time   12/12/11 10:00 PM      Component Value Range Status Comment   Specimen Description BLOOD LEFT ARM   Final    Special Requests BOTTLES DRAWN AEROBIC AND ANAEROBIC 5CC   Final    Culture  Setup Time [id number]   Final    Culture     Final    Value:        BLOOD CULTURE RECEIVED NO GROWTH TO DATE CULTURE WILL BE HELD FOR 5 DAYS BEFORE ISSUING A FINAL NEGATIVE REPORT   Report Status PENDING   Incomplete   CULTURE, BLOOD (ROUTINE X 2)     Status: Normal (Preliminary result)   Collection Time   12/12/11 10:10 PM      Component Value Range Status Comment   Specimen Description BLOOD RIGHT HAND   Final    Special Requests BOTTLES  DRAWN AEROBIC AND ANAEROBIC 5CC   Final    Culture  Setup Time [id number]   Final    Culture     Final    Value:        BLOOD CULTURE RECEIVED NO GROWTH TO DATE CULTURE WILL BE HELD FOR 5 DAYS BEFORE ISSUING A FINAL NEGATIVE REPORT   Report Status PENDING   Incomplete   URINE CULTURE     Status: Normal (Preliminary result)   Collection Time   12/12/11 10:21 PM      Component Value Range Status Comment   Specimen Description URINE, CATHETERIZED   Final    Special Requests NONE   Final    Culture  Setup Time [id number]   Final    Colony Count >=100,000 COLONIES/ML   Final    Culture GRAM NEGATIVE RODS   Final    Report Status PENDING   Incomplete   MRSA PCR SCREENING     Status: Normal   Collection Time   12/13/11  2:01 AM      Component Value Range Status Comment   MRSA by PCR NEGATIVE  NEGATIVE Final     Studies/Results: No  results found.  Medications: I have reviewed the patient's current medications. Scheduled Meds:    . antiseptic oral rinse  15 mL Mouth Rinse BID  . aspirin  81 mg Oral Daily  . atorvastatin  40 mg Oral QODAY  . carvedilol  25 mg Oral BID WC  . cefTRIAXone (ROCEPHIN)  IV  1 g Intravenous QHS  . diltiazem  10 mg Intravenous Once  . levothyroxine  175 mcg Oral Daily  . oxybutynin  5 mg Oral TID  . potassium chloride  40 mEq Oral Once  . vancomycin  1,500 mg Intravenous QHS  . warfarin  1 mg Oral Custom  . warfarin  2 mg Oral Custom  . Warfarin - Pharmacist Dosing Inpatient   Does not apply q1800  . DISCONTD: docusate sodium  100 mg Oral BID  . DISCONTD: senna  1 tablet Oral BID   Continuous Infusions:    . DISCONTD: sodium chloride 50 mL/hr at 12/14/11 0533   PRN Meds:.acetaminophen, acetaminophen, morphine injection, ondansetron (ZOFRAN) IV, ondansetron  Assessment/Plan:   *Sepsis- BP responded to IVF- abx given, improvement in vital signs   Atrial fibrillation- restart coreg, coumadin, may need to be chaged to metoprolol if HR not controlled on coreg   CHF (congestive heart failure)- restart lasix   UTI (lower urinary tract infection)- klebsiella (gram negative rods), abx continue- sensitive to rocephin   ARF (acute renal failure)- improved with IVF   Cellulitis- abx, continue to monitor- seems to be improving  Hypokalemia- repleat   PT eval   LOS: 3 days  Elliette Seabolt, DO 12/15/2011, 8:50 AM

## 2011-12-15 NOTE — Progress Notes (Signed)
Received via wheelchair to 1441.  Alert and oriented.  Oriented to room, safety video viewed.

## 2011-12-16 DIAGNOSIS — L03119 Cellulitis of unspecified part of limb: Secondary | ICD-10-CM

## 2011-12-16 DIAGNOSIS — N179 Acute kidney failure, unspecified: Secondary | ICD-10-CM

## 2011-12-16 DIAGNOSIS — L02419 Cutaneous abscess of limb, unspecified: Secondary | ICD-10-CM

## 2011-12-16 DIAGNOSIS — I4891 Unspecified atrial fibrillation: Secondary | ICD-10-CM

## 2011-12-16 DIAGNOSIS — A413 Sepsis due to Hemophilus influenzae: Secondary | ICD-10-CM

## 2011-12-16 LAB — BASIC METABOLIC PANEL
CO2: 25 mEq/L (ref 19–32)
Calcium: 9.2 mg/dL (ref 8.4–10.5)
Creatinine, Ser: 1.01 mg/dL (ref 0.50–1.10)
Glucose, Bld: 88 mg/dL (ref 70–99)

## 2011-12-16 LAB — CBC
Hemoglobin: 9.9 g/dL — ABNORMAL LOW (ref 12.0–15.0)
MCH: 28.4 pg (ref 26.0–34.0)
MCV: 88.3 fL (ref 78.0–100.0)
Platelets: 146 10*3/uL — ABNORMAL LOW (ref 150–400)
RBC: 3.49 MIL/uL — ABNORMAL LOW (ref 3.87–5.11)

## 2011-12-16 MED ORDER — VANCOMYCIN HCL IN DEXTROSE 1-5 GM/200ML-% IV SOLN
1000.0000 mg | Freq: Two times a day (BID) | INTRAVENOUS | Status: DC
  Administered 2011-12-16 – 2011-12-18 (×5): 1000 mg via INTRAVENOUS
  Filled 2011-12-16 (×8): qty 200

## 2011-12-16 MED ORDER — POTASSIUM CHLORIDE CRYS ER 20 MEQ PO TBCR
40.0000 meq | EXTENDED_RELEASE_TABLET | Freq: Every day | ORAL | Status: DC
  Administered 2011-12-16 – 2011-12-19 (×4): 40 meq via ORAL
  Filled 2011-12-16 (×4): qty 2

## 2011-12-16 MED ORDER — POTASSIUM CHLORIDE CRYS ER 20 MEQ PO TBCR
40.0000 meq | EXTENDED_RELEASE_TABLET | Freq: Once | ORAL | Status: AC
  Administered 2011-12-16: 40 meq via ORAL
  Filled 2011-12-16: qty 2

## 2011-12-16 MED ORDER — FUROSEMIDE 10 MG/ML IJ SOLN
40.0000 mg | Freq: Once | INTRAMUSCULAR | Status: AC
  Administered 2011-12-16: 40 mg via INTRAVENOUS
  Filled 2011-12-16: qty 4

## 2011-12-16 NOTE — Progress Notes (Signed)
PT Cancellation Note  Treatment cancelled today due to patient's refusal to participate.  Pt reports not feeling well today and declining therapy at this time.  [name],[name] 12/16/2011, 2:10 PM Pager: [phone]

## 2011-12-16 NOTE — Progress Notes (Signed)
ANTICOAGULATION CONSULT NOTE - Follow Up Consult  Pharmacy Consult for Warfarin Indication: atrial fibrillation  Allergies  Allergen Reactions  . Avelox (Moxifloxacin Hcl In Nacl)     Patient Measurements: Height: 5\' 7"  (170.2 cm) Weight: 233 lb 1.6 oz (105.733 kg) IBW/kg (Calculated) : 61.6   Vital Signs: Temp: 97.8 F (36.6 C) (06/18 0516) Temp src: Oral (06/18 0516) BP: 121/83 mmHg (06/18 0516) Pulse Rate: 108  (06/18 0516)  Labs:  Basename 12/16/11 0444 12/15/11 0420 12/14/11 0347  HGB 9.9* 9.7* --  HCT 30.8* 30.5* 29.5*  PLT 146* 122* 120*  APTT -- -- --  LABPROT 24.5* 23.3* 28.0*  INR 2.16* 2.03* 2.57*  HEPARINUNFRC -- -- --  CREATININE 1.01 1.05 1.12*  CKTOTAL -- -- --  CKMB -- -- --  TROPONINI -- -- --    Estimated Creatinine Clearance: 55.5 ml/min (by C-G formula based on Cr of 1.01).   Assessment:  [name] on chronic coumadin PTA for atrial fibrillation.  Home dose reported as 2 mg daily except takes 1 mg on Wednesdays and Saturdays.  INR therapeutic on admission, remains therapeutic.  No record of patient receiving doses on 6/14 or 6/15.  Hgb and platelets low but stable.  No bleeding/complications reported.  Goal of Therapy:  INR 2-3  Plan:  Continue home dosing schedule.  Patient will receive 2 mg dose tonight. Daily INR.  Verdia Kuba, PharmD Pager: [phone] 12/16/2011,1:13 PM

## 2011-12-16 NOTE — Progress Notes (Addendum)
Initially admitted to SDU with sepsis- has UTI and cellulitis.    Subjective: Wanting to go home Stating she feels much better No CP + cough and more SOB  Objective: Vital signs in last 24 hours: Filed Vitals:   12/15/11 1515 12/15/11 1544 12/15/11 2127 12/16/11 0516  BP:  129/90 140/85 121/83  Pulse: 128 111 110 108  Temp:  97.8 F (36.6 C) 99 F (37.2 C) 97.8 F (36.6 C)  TempSrc:  Oral Oral Oral  Resp:  23 20 19   Height:      Weight:    105.733 kg (233 lb 1.6 oz)  SpO2:  97% 98% 98%   Weight change: -1.267 kg (-2 lb 12.7 oz)  Intake/Output Summary (Last 24 hours) at 12/16/11 0845 Last data filed at 12/16/11 0157  Gross per 24 hour  Intake    490 ml  Output    100 ml  Net    390 ml    Physical Exam: General: Awake, Oriented, No acute distress. HEENT: EOMI. Neck: Supple [id number], tachy Lungs: Clear to ascultation bilaterally, no wheezing Abdomen: Soft, Nontender, Nondistended, +bowel sounds. Ext: Good pulses. + edema, left leg with more swelling and some warmth and redness    Lab Results:  Basename 12/16/11 0444 12/15/11 0420  NA 139 139  K 3.4* 3.9  CL 106 111  CO2 25 21  GLUCOSE 88 115*  BUN 15 16  CREATININE 1.01 1.05  CALCIUM 9.2 9.1  MG -- --  PHOS -- --   No results found for this basename: AST:2,ALT:2,ALKPHOS:2,BILITOT:2,PROT:2,ALBUMIN:2 in the last 72 hours No results found for this basename: LIPASE:2,AMYLASE:2 in the last 72 hours  Basename 12/16/11 0444 12/15/11 0420  WBC 6.2 5.4  NEUTROABS -- --  HGB 9.9* 9.7*  HCT 30.8* 30.5*  MCV 88.3 90.2  PLT 146* 122*   No results found for this basename: CKTOTAL:3,CKMB:3,CKMBINDEX:3,TROPONINI:3 in the last 72 hours No components found with this basename: POCBNP:3 No results found for this basename: DDIMER:2 in the last 72 hours No results found for this basename: HGBA1C:2 in the last 72 hours No results found for this basename: CHOL:2,HDL:2,LDLCALC:2,TRIG:2,CHOLHDL:2,LDLDIRECT:2 in the  last 72 hours No results found for this basename: TSH,T4TOTAL,FREET3,T3FREE,THYROIDAB in the last 72 hours No results found for this basename: VITAMINB12:2,FOLATE:2,FERRITIN:2,TIBC:2,IRON:2,RETICCTPCT:2 in the last 72 hours  Micro Results: Recent Results (from the past 240 hour(s))  CULTURE, BLOOD (ROUTINE X 2)     Status: Normal (Preliminary result)   Collection Time   12/12/11 10:00 PM      Component Value Range Status Comment   Specimen Description BLOOD LEFT ARM   Final    Special Requests BOTTLES DRAWN AEROBIC AND ANAEROBIC 5CC   Final    Culture  Setup Time [id number]   Final    Culture     Final    Value:        BLOOD CULTURE RECEIVED NO GROWTH TO DATE CULTURE WILL BE HELD FOR 5 DAYS BEFORE ISSUING A FINAL NEGATIVE REPORT   Report Status PENDING   Incomplete   CULTURE, BLOOD (ROUTINE X 2)     Status: Normal (Preliminary result)   Collection Time   12/12/11 10:10 PM      Component Value Range Status Comment   Specimen Description BLOOD RIGHT HAND   Final    Special Requests BOTTLES DRAWN AEROBIC AND ANAEROBIC [facility]   Final    Culture  Setup Time [id number]   Final    Culture  Final    Value:        BLOOD CULTURE RECEIVED NO GROWTH TO DATE CULTURE WILL BE HELD FOR 5 DAYS BEFORE ISSUING A FINAL NEGATIVE REPORT   Report Status PENDING   Incomplete   URINE CULTURE     Status: Normal   Collection Time   12/12/11 10:21 PM      Component Value Range Status Comment   Specimen Description URINE, CATHETERIZED   Final    Special Requests NONE   Final    Culture  Setup Time [id number]   Final    Colony Count >=100,000 COLONIES/ML   Final    Culture KLEBSIELLA PNEUMONIAE   Final    Report Status 12/15/2011 FINAL   Final    Organism ID, Bacteria KLEBSIELLA PNEUMONIAE   Final   MRSA PCR SCREENING     Status: Normal   Collection Time   12/13/11  2:01 AM      Component Value Range Status Comment   MRSA by PCR NEGATIVE  NEGATIVE Final     Studies/Results: No results  found.  Medications: I have reviewed the patient's current medications. Scheduled Meds:    . antiseptic oral rinse  15 mL Mouth Rinse BID  . aspirin  81 mg Oral Daily  . atorvastatin  40 mg Oral QODAY  . cefTRIAXone (ROCEPHIN)  IV  1 g Intravenous QHS  . furosemide  40 mg Oral Daily  . levothyroxine  175 mcg Oral Daily  . metoprolol tartrate  50 mg Oral BID  . oxybutynin  5 mg Oral TID  . potassium chloride SA  20 mEq Oral Daily  . warfarin  1 mg Oral Custom  . warfarin  2 mg Oral Custom  . Warfarin - Pharmacist Dosing Inpatient   Does not apply q1800  . DISCONTD: carvedilol  25 mg Oral BID WC  . DISCONTD: diltiazem  10 mg Intravenous Once  . DISCONTD: vancomycin  1,500 mg Intravenous QHS  . DISCONTD: vancomycin  1,000 mg Intravenous Q12H   Continuous Infusions:   PRN Meds:.acetaminophen, acetaminophen, morphine injection, ondansetron (ZOFRAN) IV, ondansetron  Assessment/Plan:   *Sepsis- BP responded to IVF- abx given, improvement in vital signs   Atrial fibrillation- changed coreg to metoprolol and adjusted to 50 mg BID- still with a minor increase in HR- may need further adjustments,  Coumadin,    CHF (congestive heart failure)- restart lasix- give extra dose of IV lasix- lungs seem more congested   UTI (lower urinary tract infection)- klebsiella (gram negative rods), abx continue- sensitive to rocephin   ARF (acute renal failure)- improved with IVF   Cellulitis- vanc/rocephin, continue to monitor- seems to be improving  Hypokalemia- repleat   PT eval- home health recommended for PT   LOS: 4 days  Nikie Cid, DO 12/16/2011, 8:45 AM

## 2011-12-17 DIAGNOSIS — L03119 Cellulitis of unspecified part of limb: Secondary | ICD-10-CM

## 2011-12-17 DIAGNOSIS — I4891 Unspecified atrial fibrillation: Secondary | ICD-10-CM

## 2011-12-17 DIAGNOSIS — A413 Sepsis due to Hemophilus influenzae: Secondary | ICD-10-CM

## 2011-12-17 DIAGNOSIS — N179 Acute kidney failure, unspecified: Secondary | ICD-10-CM

## 2011-12-17 DIAGNOSIS — L02419 Cutaneous abscess of limb, unspecified: Secondary | ICD-10-CM

## 2011-12-17 MED ORDER — FUROSEMIDE 10 MG/ML IJ SOLN
20.0000 mg | Freq: Once | INTRAMUSCULAR | Status: AC
  Administered 2011-12-17: 20 mg via INTRAVENOUS
  Filled 2011-12-17: qty 2

## 2011-12-17 NOTE — Progress Notes (Signed)
[name] is a 78 y.o. female admitted with klebsiella sepsis(uti/cellulitis). Still complaints iof painful left leg.  1. Fever   2. UTI (lower urinary tract infection)   3. Cellulitis   4. Atrial fibrillation   5. CHF (congestive heart failure)   6. CKD (chronic kidney disease)     Past Medical History  Diagnosis Date  . Hypertension   . Atrial fibrillation   . CHF (congestive heart failure)     EF 20-25% and severe MR   . CAD (coronary artery disease) 06/2010 cath    Occluded distal RCA, moderately diffuse LCx and LAD disease out of proportion to LV dysfxn, and significant MR  . Torsades de pointes     Moxifloxacin induced   . Venous insufficiency     Chronic LE edema  . Hyperlipidemia   . Hypothyroidism   . CVA (cerebral infarction)     Left hemiparesis  . CKD (chronic kidney disease)     Baseline Cr 1.1-1.2    Current Facility-Administered Medications  Medication Dose Route Frequency Provider Last Rate Last Dose  . acetaminophen (TYLENOL) tablet 650 mg  650 mg Oral Q6H PRN Carroll Kinds, MD   650 mg at 12/17/11 1708   Or  . acetaminophen (TYLENOL) suppository 650 mg  650 mg Rectal Q6H PRN Carroll Kinds, MD      . antiseptic oral rinse (BIOTENE) solution 15 mL  15 mL Mouth Rinse BID Carroll Kinds, MD   15 mL at 12/17/11 0908  . aspirin chewable tablet 81 mg  81 mg Oral Daily Carroll Kinds, MD   81 mg at 12/17/11 1031  . atorvastatin (LIPITOR) tablet 40 mg  40 mg Oral QODAY Carroll Kinds, MD   40 mg at 12/17/11 1031  . cefTRIAXone (ROCEPHIN) 1 g in dextrose 5 % 50 mL IVPB  1 g Intravenous QHS Carroll Kinds, MD   1 g at 12/16/11 2232  . furosemide (LASIX) injection 20 mg  20 mg Intravenous Once Jacklin Zwick, MD      . furosemide (LASIX) tablet 40 mg  40 mg Oral Daily Jessica U Vann, DO   40 mg at 12/17/11 1031  . levothyroxine (SYNTHROID, LEVOTHROID) tablet 175 mcg  175 mcg Oral Daily Carroll Kinds, MD   175 mcg at 12/17/11 1030  . metoprolol (LOPRESSOR) tablet 50 mg  50 mg  Oral BID Geradine Girt, DO   50 mg at 12/17/11 1030  . morphine 2 MG/ML injection 2 mg  2 mg Intravenous Q4H PRN Carroll Kinds, MD      . ondansetron (ZOFRAN) tablet 4 mg  4 mg Oral Q6H PRN Carroll Kinds, MD       Or  . ondansetron (ZOFRAN) injection 4 mg  4 mg Intravenous Q6H PRN Carroll Kinds, MD      . oxybutynin (DITROPAN) tablet 5 mg  5 mg Oral TID Carroll Kinds, MD   5 mg at 12/17/11 1709  . potassium chloride SA (K-DUR,KLOR-CON) CR tablet 40 mEq  40 mEq Oral Daily Geradine Girt, DO   40 mEq at 12/17/11 1030  . vancomycin (VANCOCIN) IVPB 1000 mg/200 mL premix  1,000 mg Intravenous Q12H Jessica U Vann, DO   1,000 mg at 12/17/11 1029  . warfarin (COUMADIN) tablet 1 mg  1 mg Oral Custom Carroll Kinds, MD   1 mg at 12/17/11 1709  . warfarin (COUMADIN) tablet 2 mg  2 mg  Oral Custom Carroll Kinds, MD   2 mg at 12/16/11 1733  . Warfarin - Pharmacist Dosing Inpatient   Does not apply [place], MD       Allergies  Allergen Reactions  . Avelox (Moxifloxacin Hcl In Nacl)    Principal Problem:  *Sepsis Active Problems:  Atrial fibrillation  CHF (congestive heart failure)  UTI (lower urinary tract infection)  ARF (acute renal failure)  Cellulitis  Hypokalemia   Vital signs in last 24 hours: Temp:  [97.4 F (36.3 C)-98.7 F (37.1 C)] 97.4 F (36.3 C) (06/19 1448) Pulse Rate:  [119-128] 120  (06/19 1448) Resp:  [20] 20  (06/19 1448) BP: (95-131)/(59-87) 95/59 mmHg (06/19 1448) SpO2:  [95 %-96 %] 95 % (06/19 1448) Weight:  [102.468 kg (225 lb 14.4 oz)] 102.468 kg (225 lb 14.4 oz) (06/19 0540) Weight change: -3.266 kg (-7 lb 3.2 oz) Last BM Date: 12/16/11  Intake/Output from previous day: 06/18 0701 - 06/19 0700 In: 1090 [P.O.:640; IV Piggyback:450] Out: 450 [Urine:450] Intake/Output this shift:    Lab Results:  Basename 12/16/11 0444 12/15/11 0420  WBC 6.2 5.4  HGB 9.9* 9.7*  HCT 30.8* 30.5*  PLT 146* 122*   BMET  Basename 12/16/11 0444 12/15/11 0420  NA 139 139    K 3.4* 3.9  CL 106 111  CO2 25 21  GLUCOSE 88 115*  BUN 15 16  CREATININE 1.01 1.05  CALCIUM 9.2 9.1    Studies/Results: No results found.  Medications: I have reviewed the patient's current medications.   Physical exam GENERAL- alert HEAD- normal atraumatic, no neck masses, normal thyroid, no jvd RESPIRATORY- appears well, vitals normal, no respiratory distress, acyanotic, normal RR, ear and throat exam is normal, neck free of mass or lymphadenopathy, chest clear, no wheezing, crepitations, rhonchi, normal symmetric air entry CVS- regular rate and rhythm, S1, S2 normal, no murmur, click, rub or gallop ABDOMEN- abdomen is soft without significant tenderness, masses, organomegaly or guarding NEURO- Grossly normal EXTREMITIES- left leg still erythematous, warm to touch.  Plan   * Sepsis/UTI (lower urinary tract infection)Cellulitis- left leg still inflamed. Continue current iv abx. * Atrial fibrillation/ CHF (congestive heart failure)- a little more iv lasix today.  * ARF (acute renal failure)- seems stable.  * hopefully home soon.   Lamyia Cdebaca 12/17/2011 7:07 PM Pager: [phone].

## 2011-12-17 NOTE — Progress Notes (Signed)
ANTICOAGULATION CONSULT NOTE - Follow Up Consult  Pharmacy Consult for Warfarin Indication: atrial fibrillation  Allergies  Allergen Reactions  . Avelox (Moxifloxacin Hcl In Nacl)     Patient Measurements: Height: 5\' 7"  (170.2 cm) Weight: 225 lb 14.4 oz (102.468 kg) IBW/kg (Calculated) : 61.6   Vital Signs: Temp: 98.7 F (37.1 C) (06/19 0540) Temp src: Oral (06/19 0540) BP: 125/87 mmHg (06/19 0540) Pulse Rate: 119  (06/19 0540)  Labs:  Basename 12/17/11 0400 12/16/11 0444 12/15/11 0420  HGB -- 9.9* 9.7*  HCT -- 30.8* 30.5*  PLT -- 146* 122*  APTT -- -- --  LABPROT 27.7* 24.5* 23.3*  INR 2.53* 2.16* 2.03*  HEPARINUNFRC -- -- --  CREATININE -- 1.01 1.05  CKTOTAL -- -- --  CKMB -- -- --  TROPONINI -- -- --    Estimated Creatinine Clearance: 54.7 ml/min (by C-G formula based on Cr of 1.01).   Assessment:  48 YOF on chronic coumadin PTA for atrial fibrillation.  Home dose reported as 2 mg daily except takes 1 mg on Wednesdays and Saturdays.  INR therapeutic on admission, remains therapeutic.  No record of patient receiving doses on 6/14 or 6/15.  Hgb and platelets low but stable.  No bleeding/complications reported.  Goal of Therapy:  INR 2-3  Plan:  Continue home dosing schedule.  Patient will receive 1 mg dose tonight. Daily INR.  Verdia Kuba, PharmD Pager: [phone] 12/17/2011,9:49 AM

## 2011-12-17 NOTE — Progress Notes (Signed)
Physical Therapy Treatment Patient Details Name: [name] MRN: [id number] DOB: 11/12/1929 Today's Date: 12/17/2011 Time: [id number] PT Time Calculation (min): 20 min  PT Assessment / Plan / Recommendation Comments on Treatment Session  Pt assisted to recliner with +2 for safety and hygiene after urinating in bed.    Follow Up Recommendations  Home health PT;Supervision for mobility/OOB    Barriers to Discharge        Equipment Recommendations  Rolling walker with 5" wheels    Recommendations for Other Services    Frequency     Plan Discharge plan remains appropriate;Frequency remains appropriate    Precautions / Restrictions Precautions Precautions: Fall   Pertinent Vitals/Pain No pain at rest.  Pt reporting pain in R foot from sock with transfer.    Mobility  Bed Mobility Bed Mobility: Supine to Sit Supine to Sit: HOB elevated;3: Mod assist Sitting - Scoot to Edge of Bed: 4: Min guard;With rail Details for Bed Mobility Assistance: assist for L LE off bed and for trunk to upright, increased use of UEs to self assist Transfers Transfers: Sit to Stand;Stand to Sit;Stand Pivot Transfers Sit to Stand: 3: Mod assist;From elevated surface;With upper extremity assist;From bed Stand to Sit: To chair/3-in-1;3: Mod assist Stand Pivot Transfers: 4: Min assist Details for Transfer Assistance: verbal cues for safe technique, assist 2* weakness, good forward lean to assist to stand, pt given verbal cues for stepping over to recliner, pt reports pain in R foot limiting mobility and requesting to sit (+2 for safety)    Exercises     PT Diagnosis:    PT Problem List:   PT Treatment Interventions:     PT Goals Acute Rehab PT Goals PT Goal: Supine/Side to Sit - Progress: Progressing toward goal PT Goal: Sit to Stand - Progress: Progressing toward goal PT Goal: Stand to Sit - Progress: Progressing toward goal  Visit Information  Last PT Received On: 12/17/11 Assistance Needed:  +2    Subjective Data  Subjective: "I'm soaked." urinated in bed   Cognition  Overall Cognitive Status: Appears within functional limits for tasks assessed/performed    Balance     End of Session PT - End of Session Activity Tolerance: Patient limited by fatigue;Patient limited by pain Patient left: in chair;with call bell/phone within reach;with family/visitor present    Chistopher Mangino,[name] 12/17/2011, 2:50 PM Pager: [phone]

## 2011-12-18 ENCOUNTER — Inpatient Hospital Stay (HOSPITAL_COMMUNITY): Payer: Medicare Other

## 2011-12-18 DIAGNOSIS — E782 Mixed hyperlipidemia: Secondary | ICD-10-CM

## 2011-12-18 DIAGNOSIS — N179 Acute kidney failure, unspecified: Secondary | ICD-10-CM

## 2011-12-18 DIAGNOSIS — L03119 Cellulitis of unspecified part of limb: Secondary | ICD-10-CM

## 2011-12-18 DIAGNOSIS — I4891 Unspecified atrial fibrillation: Secondary | ICD-10-CM

## 2011-12-18 DIAGNOSIS — L02419 Cutaneous abscess of limb, unspecified: Secondary | ICD-10-CM

## 2011-12-18 LAB — GLUCOSE, CAPILLARY

## 2011-12-18 LAB — CBC
Hemoglobin: 10.3 g/dL — ABNORMAL LOW (ref 12.0–15.0)
RBC: 3.61 MIL/uL — ABNORMAL LOW (ref 3.87–5.11)
WBC: 8.6 10*3/uL (ref 4.0–10.5)

## 2011-12-18 LAB — PROTIME-INR
INR: 2.65 — ABNORMAL HIGH (ref 0.00–1.49)
Prothrombin Time: 28.7 seconds — ABNORMAL HIGH (ref 11.6–15.2)

## 2011-12-18 LAB — BASIC METABOLIC PANEL
CO2: 28 mEq/L (ref 19–32)
Glucose, Bld: 112 mg/dL — ABNORMAL HIGH (ref 70–99)
Potassium: 3.6 mEq/L (ref 3.5–5.1)
Sodium: 136 mEq/L (ref 135–145)

## 2011-12-18 MED ORDER — POTASSIUM CHLORIDE 20 MEQ/15ML (10%) PO LIQD
40.0000 meq | Freq: Once | ORAL | Status: AC
  Administered 2011-12-18: 40 meq via ORAL
  Filled 2011-12-18: qty 30

## 2011-12-18 MED ORDER — VANCOMYCIN HCL IN DEXTROSE 1-5 GM/200ML-% IV SOLN
1000.0000 mg | INTRAVENOUS | Status: DC
  Administered 2011-12-19: 1000 mg via INTRAVENOUS
  Filled 2011-12-18: qty 200

## 2011-12-18 MED ORDER — IOHEXOL 300 MG/ML  SOLN
100.0000 mL | Freq: Once | INTRAMUSCULAR | Status: AC | PRN
  Administered 2011-12-18: 100 mL via INTRAVENOUS

## 2011-12-18 NOTE — Progress Notes (Signed)
SUBJECTIVE Feels a little better but left leg still painful and swollen.  1. Fever   2. UTI (lower urinary tract infection)   3. Cellulitis   4. Atrial fibrillation   5. CHF (congestive heart failure)   6. CKD (chronic kidney disease)     Past Medical History  Diagnosis Date  . Hypertension   . Atrial fibrillation   . CHF (congestive heart failure)     EF 20-25% and severe MR   . CAD (coronary artery disease) 06/2010 cath    Occluded distal RCA, moderately diffuse LCx and LAD disease out of proportion to LV dysfxn, and significant MR  . Torsades de pointes     Moxifloxacin induced   . Venous insufficiency     Chronic LE edema  . Hyperlipidemia   . Hypothyroidism   . CVA (cerebral infarction)     Left hemiparesis  . CKD (chronic kidney disease)     Baseline Cr 1.1-1.2    Current Facility-Administered Medications  Medication Dose Route Frequency Provider Last Rate Last Dose  . acetaminophen (TYLENOL) tablet 650 mg  650 mg Oral Q6H PRN Carroll Kinds, MD   650 mg at 12/17/11 1708   Or  . acetaminophen (TYLENOL) suppository 650 mg  650 mg Rectal Q6H PRN Carroll Kinds, MD      . antiseptic oral rinse (BIOTENE) solution 15 mL  15 mL Mouth Rinse BID Carroll Kinds, MD   15 mL at 12/18/11 0851  . aspirin chewable tablet 81 mg  81 mg Oral Daily Carroll Kinds, MD   81 mg at 12/18/11 0958  . atorvastatin (LIPITOR) tablet 40 mg  40 mg Oral QODAY Carroll Kinds, MD   40 mg at 12/17/11 1031  . cefTRIAXone (ROCEPHIN) 1 g in dextrose 5 % 50 mL IVPB  1 g Intravenous QHS Carroll Kinds, MD   1 g at 12/17/11 2239  . furosemide (LASIX) injection 20 mg  20 mg Intravenous Once Mercer Stallworth, MD   20 mg at 12/17/11 1930  . furosemide (LASIX) tablet 40 mg  40 mg Oral Daily Jessica U Vann, DO   40 mg at 12/18/11 1002  . levothyroxine (SYNTHROID, LEVOTHROID) tablet 175 mcg  175 mcg Oral Daily Carroll Kinds, MD   175 mcg at 12/18/11 1004  . metoprolol (LOPRESSOR) tablet 50 mg  50 mg Oral BID Geradine Girt, DO    50 mg at 12/18/11 1002  . morphine 2 MG/ML injection 2 mg  2 mg Intravenous Q4H PRN Carroll Kinds, MD      . ondansetron (ZOFRAN) tablet 4 mg  4 mg Oral Q6H PRN Carroll Kinds, MD       Or  . ondansetron (ZOFRAN) injection 4 mg  4 mg Intravenous Q6H PRN Carroll Kinds, MD      . oxybutynin (DITROPAN) tablet 5 mg  5 mg Oral TID Carroll Kinds, MD   5 mg at 12/18/11 1003  . potassium chloride 20 MEQ/15ML (10%) liquid 40 mEq  40 mEq Oral Once Lamel Mccarley, MD      . potassium chloride SA (K-DUR,KLOR-CON) CR tablet 40 mEq  40 mEq Oral Daily Geradine Girt, DO   40 mEq at 12/18/11 1006  . vancomycin (VANCOCIN) IVPB 1000 mg/200 mL premix  1,000 mg Intravenous Q12H Geradine Girt, DO   1,000 mg at 12/18/11 0851  . warfarin (COUMADIN) tablet 1 mg  1 mg Oral Custom Eric L  Bonno, MD   1 mg at 12/17/11 1709  . warfarin (COUMADIN) tablet 2 mg  2 mg Oral Custom Carroll Kinds, MD   2 mg at 12/16/11 1733  . Warfarin - Pharmacist Dosing Inpatient   Does not apply [place], MD       Allergies  Allergen Reactions  . Avelox (Moxifloxacin Hcl In Nacl)    Principal Problem:  *Sepsis Active Problems:  Atrial fibrillation  CHF (congestive heart failure)  UTI (lower urinary tract infection)  ARF (acute renal failure)  Cellulitis  Hypokalemia   Vital signs in last 24 hours: Temp:  [97.8 F (36.6 C)-98.2 F (36.8 C)] 98.2 F (36.8 C) (06/20 1400) Pulse Rate:  [110-123] 110  (06/20 1400) Resp:  [18-20] 18  (06/20 1400) BP: (114-118)/(76-81) 118/77 mmHg (06/20 1400) SpO2:  [95 %-97 %] 97 % (06/20 1400) Weight:  [102.4 kg (225 lb 12 oz)] 102.4 kg (225 lb 12 oz) (06/20 [id number]) Weight change: -0.067 kg (-2.4 oz) Last BM Date: 12/17/11  Intake/Output from previous day: 06/19 0701 - 06/20 0700 In: 350 [P.O.:300; IV Piggyback:50] Out: -  Intake/Output this shift: Total I/O In: 600 [P.O.:600] Out: -   Lab Results:  Basename 12/18/11 0420 12/16/11 0444  WBC 8.6 6.2  HGB 10.3* 9.9*  HCT 31.7*  30.8*  PLT 202 146*   BMET  Basename 12/18/11 0420 12/16/11 0444  NA 136 139  K 3.6 3.4*  CL 100 106  CO2 28 25  GLUCOSE 112* 88  BUN 16 15  CREATININE 1.15* 1.01  CALCIUM 9.0 9.2    Studies/Results: No results found.  Medications: I have reviewed the patient's current medications.   Physical exam GENERAL- alert HEAD- normal atraumatic, no neck masses, normal thyroid, no jvd RESPIRATORY- appears well, vitals normal, no respiratory distress, acyanotic, normal RR, ear and throat exam is normal, neck free of mass or lymphadenopathy, chest clear, no wheezing, crepitations, rhonchi, normal symmetric air entry CVS- regular rate and rhythm, S1, S2 normal, no murmur, click, rub or gallop ABDOMEN- abdomen is soft without significant tenderness, masses, organomegaly or guarding NEURO- Grossly normal EXTREMITIES- left leg remains swollen and tender compared to right side, warmer to touch.  Plan   * Sepsis/UTI (lower urinary tract infection)Cellulitis- left leg still inflamed.Concern for abscess formation. Obtain ct of same leg.  * Atrial fibrillation/ CHF (congestive heart failure)- fluctuation heart rate. Monitor. * ARF (acute renal failure)- seems stable.  * hopefully home soon.     Oyuki Hogan 12/18/2011 3:42 PM Pager: [phone].

## 2011-12-18 NOTE — Progress Notes (Signed)
ANTIBIOTIC CONSULT NOTE   Pharmacy Consult for vancomycin Indication: rule out sepsis  Allergies  Allergen Reactions  . Avelox (Moxifloxacin Hcl In Nacl)    Patient Measurements: Height: 5\' 7"  (170.2 cm) Weight: 225 lb 12 oz (102.4 kg) IBW/kg (Calculated) : 61.6   Labs:  Basename 12/18/11 0420 12/16/11 0444  WBC 8.6 6.2  HGB 10.3* 9.9*  PLT 202 146*  LABCREA -- --  CREATININE 1.15* 1.01   Estimated Creatinine Clearance: 48 ml/min (by C-G formula based on Cr of 1.15).  Basename 12/18/11 2023  VANCOTROUGH 24.9*  VANCOPEAK --  Jake Michaelis --  GENTTROUGH --  GENTPEAK --  GENTRANDOM --  TOBRATROUGH --  TOBRAPEAK --  TOBRARND --  AMIKACINPEAK --  AMIKACINTROU --  AMIKACIN --    Medications:  Anti-infectives     Start     Dose/Rate Route Frequency Ordered Stop   12/16/11 0900   vancomycin (VANCOCIN) IVPB 1000 mg/200 mL premix        1,000 mg 200 mL/hr over 60 Minutes Intravenous Every 12 hours 12/16/11 0846     12/15/11 2230   vancomycin (VANCOCIN) IVPB 1000 mg/200 mL premix  Status:  Discontinued        1,000 mg 200 mL/hr over 60 Minutes Intravenous Every 12 hours 12/15/11 2206 12/16/11 0844   12/13/11 2200   cefTRIAXone (ROCEPHIN) 1 g in dextrose 5 % 50 mL IVPB        1 g 100 mL/hr over 30 Minutes Intravenous Daily at bedtime 12/13/11 0224     12/13/11 2200   vancomycin (VANCOCIN) 1,500 mg in sodium chloride 0.9 % 500 mL IVPB  Status:  Discontinued        1,500 mg 250 mL/hr over 120 Minutes Intravenous Daily at bedtime 12/13/11 0546 12/15/11 2206   12/13/11 0245   vancomycin (VANCOCIN) 1,500 mg in sodium chloride 0.9 % 500 mL IVPB        1,500 mg 250 mL/hr over 120 Minutes Intravenous  Once 12/13/11 0240 12/13/11 0501   12/12/11 2330   cefTRIAXone (ROCEPHIN) 1 g in dextrose 5 % 50 mL IVPB        1 g 100 mL/hr over 30 Minutes Intravenous  Once 12/12/11 2316 12/13/11 0025         Assessment:  79 YOF presenting with s/sx's sepsis   Day #6 vancomycin,  D#7 ceftriaxone for Klebsiella UTI and leg celllulitis  Renal function improving  Vancomycin trough elevated after dosage increase on 6/17 and rising Scr  Goal of Therapy:  Vancomycin trough level 15-20 mcg/ml  Plan:  Decrease vancomycin to 1gm IV q24h  Re-check Vanc trough at steady state.  Follow up renal fxn and culture results if tx continues.   Peggyann Juba, PharmD, BCPS Pager: [phone] 12/18/2011 9:29 PM

## 2011-12-18 NOTE — Plan of Care (Signed)
Problem: Phase I Progression Outcomes Goal: EF % per last Echo/documented,Core Reminder form on chart Outcome: Completed/Met Date Met:  12/18/11 EF= 55-65% Echo taken on 09/21/2003

## 2011-12-18 NOTE — Progress Notes (Signed)
ANTICOAGULATION CONSULT NOTE - Follow Up Consult  Pharmacy Consult for Warfarin Indication: atrial fibrillation  Allergies  Allergen Reactions  . Avelox (Moxifloxacin Hcl In Nacl)     Patient Measurements: Height: 5\' 7"  (170.2 cm) Weight: 225 lb 12 oz (102.4 kg) IBW/kg (Calculated) : 61.6   Vital Signs: Temp: 98 F (36.7 C) (06/20 0632) Temp src: Oral (06/20 0632) BP: 114/80 mmHg (06/20 [id number]) Pulse Rate: 120  (06/20 0632)  Labs:  Basename 12/18/11 0420 12/17/11 0400 12/16/11 0444  HGB 10.3* -- 9.9*  HCT 31.7* -- 30.8*  PLT 202 -- 146*  APTT -- -- --  LABPROT 28.7* 27.7* 24.5*  INR 2.65* 2.53* 2.16*  HEPARINUNFRC -- -- --  CREATININE 1.15* -- 1.01  CKTOTAL -- -- --  CKMB -- -- --  TROPONINI -- -- --    Estimated Creatinine Clearance: 48 ml/min (by C-G formula based on Cr of 1.15).   Assessment:  [name] on chronic coumadin PTA for atrial fibrillation.  Home dose reported as 2 mg daily except takes 1 mg on Wednesdays and Saturdays.  INR therapeutic on admission, remains therapeutic.  No record of patient receiving doses on 6/14 or 6/15.  No bleeding/complications reported.  Goal of Therapy:  INR 2-3  Plan:  Continue home dosing schedule.  Patient will receive 2 mg dose tonight. Daily INR.  Verdia Kuba, PharmD Pager: [phone] 12/18/2011,8:40 AM

## 2011-12-19 DIAGNOSIS — I4891 Unspecified atrial fibrillation: Secondary | ICD-10-CM

## 2011-12-19 DIAGNOSIS — L03119 Cellulitis of unspecified part of limb: Secondary | ICD-10-CM

## 2011-12-19 DIAGNOSIS — N179 Acute kidney failure, unspecified: Secondary | ICD-10-CM

## 2011-12-19 DIAGNOSIS — L02419 Cutaneous abscess of limb, unspecified: Secondary | ICD-10-CM

## 2011-12-19 DIAGNOSIS — E782 Mixed hyperlipidemia: Secondary | ICD-10-CM

## 2011-12-19 LAB — COMPREHENSIVE METABOLIC PANEL
Albumin: 2.7 g/dL — ABNORMAL LOW (ref 3.5–5.2)
Alkaline Phosphatase: 76 U/L (ref 39–117)
BUN: 18 mg/dL (ref 6–23)
Calcium: 9.4 mg/dL (ref 8.4–10.5)
Potassium: 3.9 mEq/L (ref 3.5–5.1)
Sodium: 136 mEq/L (ref 135–145)
Total Protein: 6.8 g/dL (ref 6.0–8.3)

## 2011-12-19 LAB — CBC
HCT: 26.7 % — ABNORMAL LOW (ref 36.0–46.0)
MCH: 28.9 pg (ref 26.0–34.0)
MCHC: 33 g/dL (ref 30.0–36.0)
RDW: 14.3 % (ref 11.5–15.5)

## 2011-12-19 LAB — CULTURE, BLOOD (ROUTINE X 2)
Culture  Setup Time: 201306150212
Culture: NO GROWTH

## 2011-12-19 LAB — PROTIME-INR
INR: 1.85 — ABNORMAL HIGH (ref 0.00–1.49)
Prothrombin Time: 21.7 seconds — ABNORMAL HIGH (ref 11.6–15.2)

## 2011-12-19 LAB — GLUCOSE, CAPILLARY: Glucose-Capillary: 112 mg/dL — ABNORMAL HIGH (ref 70–99)

## 2011-12-19 MED ORDER — WARFARIN SODIUM 3 MG PO TABS
3.0000 mg | ORAL_TABLET | Freq: Once | ORAL | Status: DC
  Filled 2011-12-19: qty 1

## 2011-12-19 MED ORDER — DOXYCYCLINE HYCLATE 100 MG PO TABS: 100.0000 mg | ORAL_TABLET | Freq: Two times a day (BID) | ORAL | Status: AC

## 2011-12-19 NOTE — Discharge Summary (Signed)
DISCHARGE SUMMARY  [name]  MR#: [id number]  DOB:03/05/1930  Date of Admission: 12/12/2011 Date of Discharge: 12/19/2011  Attending Physician:Violetta Lavalle  Patient's [id number] [name], MD  Consults: none. Discharge Diagnoses: Present on Admission:  .Atrial fibrillation .CHF (congestive heart failure) .Sepsis .UTI (lower urinary tract infection) .ARF (acute renal failure) .Cellulitis .Hypokalemia  Hospital Course: [name] was admitted with confusion, and was found to be septic due to klebsiella uti, and left leg cellulitis. Her blood cultures were negative but urine culture positive for klebsiella. She was placed on broad spectrum abx including vanc/zosyn/ceftriaxone. The cellulitis was slow to respond, hence ct of left leg with contrast to rule out abscess formation on 12/18/11(just cellulitis, no abscess). She will need close monitoring of her creatinine, being on arb, and having just received contrast. I have given her another 4 days of abx with doxycycline as the leg is still inflamed, although definitely improving. She should have inr/bmp checked in the next 3-5 days in coordination with her pcp. She should follow with cardiology as scheduled. Have requested home health to assist with PT/ADLs/meds/labs. She is discharged in stable condition.   Medication List  As of 12/19/2011  8:05 AM   TAKE these medications         aspirin 81 MG chewable tablet   Chew 81 mg by mouth daily.      atorvastatin 80 MG tablet   Commonly known as: LIPITOR   Take 40 mg by mouth every other day.      carvedilol 25 MG tablet   Commonly known as: COREG   Take 25 mg by mouth 2 (two) times daily with a meal.      doxycycline 100 MG tablet   Commonly known as: VIBRA-TABS   Take 1 tablet (100 mg total) by mouth 2 (two) times daily.      furosemide 40 MG tablet   Commonly known as: LASIX   Take 40 mg by mouth daily.      levothyroxine 175 MCG tablet   Commonly known as:  SYNTHROID, LEVOTHROID   Take 175 mcg by mouth daily.      losartan 100 MG tablet   Commonly known as: COZAAR   Take 100 mg by mouth daily.      oxybutynin 5 MG tablet   Commonly known as: DITROPAN   Take 5 mg by mouth 3 (three) times daily. Bladder spasms      potassium chloride SA 20 MEQ tablet   Commonly known as: K-DUR,KLOR-CON   Take 20 mEq by mouth daily.      warfarin 2 MG tablet   Commonly known as: COUMADIN   Take 1-2 mg by mouth See admin instructions. Wednesdays and saturdays take 1 mg. All other days take 2mg .             Day of Discharge BP 103/75  Pulse 108  Temp 98.2 F (36.8 C) (Oral)  Resp 18  Ht 5\' 7"  (1.702 m)  Wt 99.5 kg (219 lb 5.7 oz)  BMI 34.36 kg/m2  SpO2 96%  Physical Exam: Left leg less inflamed.  Results for orders placed during the hospital encounter of 12/12/11 (from the past 24 hour(s))  VANCOMYCIN, TROUGH     Status: Abnormal   Collection Time   12/18/11  8:23 PM      Component Value Range   Vancomycin Tr 24.9 (*) 10.0 - 20.0 ug/mL  PROTIME-INR     Status: Abnormal   Collection Time   12/19/11  4:25 AM      Component Value Range   Prothrombin Time 21.7 (*) 11.6 - 15.2 seconds   INR 1.85 (*) 0.00 - 1.49  CBC     Status: Abnormal   Collection Time   12/19/11  4:25 AM      Component Value Range   WBC 9.4  4.0 - 10.5 K/uL   RBC 3.05 (*) 3.87 - 5.11 MIL/uL   Hemoglobin 8.8 (*) 12.0 - 15.0 g/dL   HCT 26.7 (*) 36.0 - 46.0 %   MCV 87.5  78.0 - 100.0 fL   MCH 28.9  26.0 - 34.0 pg   MCHC 33.0  30.0 - 36.0 g/dL   RDW 14.3  11.5 - 15.5 %   Platelets COUNT MAY BE INACCURATE DUE TO FIBRIN CLUMPS.  150 - 400 K/uL  COMPREHENSIVE METABOLIC PANEL     Status: Abnormal   Collection Time   12/19/11  4:25 AM      Component Value Range   Sodium 136  135 - 145 mEq/L   Potassium 3.9  3.5 - 5.1 mEq/L   Chloride 101  96 - 112 mEq/L   CO2 24  19 - 32 mEq/L   Glucose, Bld 118 (*) 70 - 99 mg/dL   BUN 18  6 - 23 mg/dL   Creatinine, Ser 1.14 (*)  0.50 - 1.10 mg/dL   Calcium 9.4  8.4 - 10.5 mg/dL   Total Protein 6.8  6.0 - 8.3 g/dL   Albumin 2.7 (*) 3.5 - 5.2 g/dL   AST 16  0 - 37 U/L   ALT 16  0 - 35 U/L   Alkaline Phosphatase 76  39 - 117 U/L   Total Bilirubin 0.5  0.3 - 1.2 mg/dL   GFR calc non Af Amer 44 (*) >90 mL/min   GFR calc Af Amer 51 (*) >90 mL/min  GLUCOSE, CAPILLARY     Status: Abnormal   Collection Time   12/19/11  7:20 AM      Component Value Range   Glucose-Capillary 112 (*) 70 - 99 mg/dL    Disposition: home with home health today.   Follow-up Appts: Discharge Orders    Future Orders Please Complete By Expires   Diet - low sodium heart healthy      Increase activity slowly           Tests Needing Follow-up: Inr/bmp in 3-5 days.  Time spent in discharge (includes decision making & examination of pt): 40 minutes  Signed: Bassel Gaskill 12/19/2011, 8:05 AM

## 2011-12-19 NOTE — Progress Notes (Signed)
ANTICOAGULATION CONSULT NOTE - Follow Up Consult  Pharmacy Consult for Warfarin Indication: atrial fibrillation  Allergies  Allergen Reactions  . Avelox (Moxifloxacin Hcl In Nacl)     Patient Measurements: Height: 5\' 7"  (170.2 cm) Weight: 219 lb 5.7 oz (99.5 kg) IBW/kg (Calculated) : 61.6   Vital Signs: Temp: 98.2 F (36.8 C) (06/21 0511) Temp src: Oral (06/21 0511) BP: 103/75 mmHg (06/21 0511) Pulse Rate: 108  (06/21 0511)  Labs:  Basename 12/19/11 0425 12/18/11 0420 12/17/11 0400  HGB 8.8* 10.3* --  HCT 26.7* 31.7* --  PLT COUNT MAY BE INACCURATE DUE TO FIBRIN CLUMPS. 202 --  APTT -- -- --  LABPROT 21.7* 28.7* 27.7*  INR 1.85* 2.65* 2.53*  HEPARINUNFRC -- -- --  CREATININE 1.14* 1.15* --  CKTOTAL -- -- --  CKMB -- -- --  TROPONINI -- -- --    Estimated Creatinine Clearance: 47.7 ml/min (by C-G formula based on Cr of 1.14).   Assessment:  41 YOF on chronic coumadin PTA for atrial fibrillation.  Home dose reported as 2 mg daily except takes 1 mg on Wednesdays and Saturdays.  INR therapeutic on admission, but decreased unexpectedly overnight, now subtherapeutic.  No record of patient receiving doses on 6/14 or 6/15.  No bleeding/complications reported.  Goal of Therapy:  INR 2-3  Plan:  1) D/C scheduled warfarin doses 2) Increase warfarin to 3mg  PO x1 tonight 3) Daily INR.  Verdia Kuba, PharmD Pager: [phone] 12/19/2011,9:40 AM

## 2011-12-19 NOTE — Progress Notes (Signed)
Talked to patient about [facility]; List provided with [facility] agencies, patient chose [facility]. Levora Dredge RN with [facility] called for arrangements.

## 2011-12-19 NOTE — Progress Notes (Signed)
Pt left hospital at this time with her daughter at her side. Pt alert, oriented, and without c/o. No s/s of acute distress noted. Pt left with her new walker with her. Discharge instructions given/explained with pt verbalizing understanding. Noted to pickup prescription at [facility] on [address]. PT in place with [place].

## 2011-12-27 ENCOUNTER — Other Ambulatory Visit: Payer: Self-pay | Admitting: Cardiology

## 2014-10-17 ENCOUNTER — Ambulatory Visit
Admission: RE | Admit: 2014-10-17 | Discharge: 2014-10-17 | Disposition: A | Discharge status: Home / Self Care | Payer: Medicare Other | Source: Ambulatory Visit | Attending: Family Medicine | Admitting: Family Medicine

## 2014-10-17 ENCOUNTER — Other Ambulatory Visit: Payer: Self-pay | Admitting: Family Medicine

## 2014-10-17 DIAGNOSIS — J209 Acute bronchitis, unspecified: Secondary | ICD-10-CM

## 2016-01-21 ENCOUNTER — Encounter (HOSPITAL_COMMUNITY): Payer: Self-pay | Admitting: *Deleted

## 2016-01-21 ENCOUNTER — Emergency Department (HOSPITAL_COMMUNITY): Payer: Medicare Other

## 2016-01-21 ENCOUNTER — Inpatient Hospital Stay (HOSPITAL_COMMUNITY): Payer: Medicare Other

## 2016-01-21 ENCOUNTER — Inpatient Hospital Stay (HOSPITAL_COMMUNITY)
Admission: EM | Admit: 2016-01-21 | Discharge: 2016-01-24 | DRG: 812 | Disposition: A | Discharge status: Home / Self Care | Payer: Medicare Other | Attending: Internal Medicine | Admitting: Internal Medicine

## 2016-01-21 DIAGNOSIS — D509 Iron deficiency anemia, unspecified: Principal | ICD-10-CM | POA: Diagnosis present

## 2016-01-21 DIAGNOSIS — E785 Hyperlipidemia, unspecified: Secondary | ICD-10-CM | POA: Diagnosis present

## 2016-01-21 DIAGNOSIS — I2510 Atherosclerotic heart disease of native coronary artery without angina pectoris: Secondary | ICD-10-CM | POA: Diagnosis present

## 2016-01-21 DIAGNOSIS — Z7982 Long term (current) use of aspirin: Secondary | ICD-10-CM | POA: Diagnosis not present

## 2016-01-21 DIAGNOSIS — I4891 Unspecified atrial fibrillation: Secondary | ICD-10-CM | POA: Diagnosis present

## 2016-01-21 DIAGNOSIS — R21 Rash and other nonspecific skin eruption: Secondary | ICD-10-CM | POA: Diagnosis not present

## 2016-01-21 DIAGNOSIS — R52 Pain, unspecified: Secondary | ICD-10-CM

## 2016-01-21 DIAGNOSIS — I482 Chronic atrial fibrillation: Secondary | ICD-10-CM | POA: Diagnosis not present

## 2016-01-21 DIAGNOSIS — D649 Anemia, unspecified: Secondary | ICD-10-CM | POA: Diagnosis present

## 2016-01-21 DIAGNOSIS — Z881 Allergy status to other antibiotic agents status: Secondary | ICD-10-CM | POA: Diagnosis not present

## 2016-01-21 DIAGNOSIS — I130 Hypertensive heart and chronic kidney disease with heart failure and stage 1 through stage 4 chronic kidney disease, or unspecified chronic kidney disease: Secondary | ICD-10-CM | POA: Diagnosis present

## 2016-01-21 DIAGNOSIS — N183 Chronic kidney disease, stage 3 unspecified: Secondary | ICD-10-CM | POA: Diagnosis present

## 2016-01-21 DIAGNOSIS — I5022 Chronic systolic (congestive) heart failure: Secondary | ICD-10-CM | POA: Diagnosis present

## 2016-01-21 DIAGNOSIS — Z7983 Long term (current) use of bisphosphonates: Secondary | ICD-10-CM | POA: Diagnosis not present

## 2016-01-21 DIAGNOSIS — Z8673 Personal history of transient ischemic attack (TIA), and cerebral infarction without residual deficits: Secondary | ICD-10-CM

## 2016-01-21 DIAGNOSIS — R0789 Other chest pain: Secondary | ICD-10-CM | POA: Diagnosis present

## 2016-01-21 DIAGNOSIS — I5042 Chronic combined systolic (congestive) and diastolic (congestive) heart failure: Secondary | ICD-10-CM | POA: Diagnosis present

## 2016-01-21 DIAGNOSIS — R0781 Pleurodynia: Secondary | ICD-10-CM

## 2016-01-21 DIAGNOSIS — E039 Hypothyroidism, unspecified: Secondary | ICD-10-CM | POA: Diagnosis not present

## 2016-01-21 DIAGNOSIS — R791 Abnormal coagulation profile: Secondary | ICD-10-CM

## 2016-01-21 DIAGNOSIS — Z87891 Personal history of nicotine dependence: Secondary | ICD-10-CM | POA: Diagnosis not present

## 2016-01-21 DIAGNOSIS — Z7901 Long term (current) use of anticoagulants: Secondary | ICD-10-CM

## 2016-01-21 LAB — BASIC METABOLIC PANEL
Anion gap: 6 (ref 5–15)
BUN: 19 mg/dL (ref 6–20)
CHLORIDE: 108 mmol/L (ref 101–111)
CO2: 25 mmol/L (ref 22–32)
CREATININE: 1.34 mg/dL — AB (ref 0.44–1.00)
Calcium: 9.5 mg/dL (ref 8.9–10.3)
GFR calc non Af Amer: 35 mL/min — ABNORMAL LOW (ref >60)
GFR, EST AFRICAN AMERICAN: 41 mL/min — AB (ref >60)
Glucose, Bld: 108 mg/dL — ABNORMAL HIGH (ref 65–99)
POTASSIUM: 4 mmol/L (ref 3.5–5.1)
Sodium: 139 mmol/L (ref 135–145)

## 2016-01-21 LAB — PREPARE RBC (CROSSMATCH)

## 2016-01-21 LAB — CBC WITH DIFFERENTIAL/PLATELET
BASOS ABS: 0 10*3/uL (ref 0.0–0.1)
BASOS PCT: 0 %
EOS ABS: 0.4 10*3/uL (ref 0.0–0.7)
Eosinophils Relative: 7 %
HCT: 24.3 % — ABNORMAL LOW (ref 36.0–46.0)
HEMOGLOBIN: 6.5 g/dL — AB (ref 12.0–15.0)
LYMPHS PCT: 16 %
Lymphs Abs: 0.9 10*3/uL (ref 0.7–4.0)
MCH: 19.2 pg — ABNORMAL LOW (ref 26.0–34.0)
MCHC: 26.7 g/dL — ABNORMAL LOW (ref 30.0–36.0)
MCV: 71.7 fL — ABNORMAL LOW (ref 78.0–100.0)
MONO ABS: 0.5 10*3/uL (ref 0.1–1.0)
Monocytes Relative: 9 %
NEUTROS PCT: 68 %
Neutro Abs: 3.9 10*3/uL (ref 1.7–7.7)
PLATELETS: 208 10*3/uL (ref 150–400)
RBC: 3.39 MIL/uL — AB (ref 3.87–5.11)
RDW: 20.8 % — ABNORMAL HIGH (ref 11.5–15.5)
WBC: 5.7 10*3/uL (ref 4.0–10.5)

## 2016-01-21 LAB — BRAIN NATRIURETIC PEPTIDE: B NATRIURETIC PEPTIDE 5: 421.6 pg/mL — AB (ref 0.0–100.0)

## 2016-01-21 LAB — POC OCCULT BLOOD, ED: Fecal Occult Bld: NEGATIVE

## 2016-01-21 LAB — I-STAT TROPONIN, ED: Troponin i, poc: 0 ng/mL (ref 0.00–0.08)

## 2016-01-21 LAB — PROTIME-INR
INR: 3.55 — AB (ref 0.00–1.49)
INR: 2.92 — ABNORMAL HIGH (ref 0.00–1.49)
Prothrombin Time: 34.8 seconds — ABNORMAL HIGH (ref 11.6–15.2)
Prothrombin Time: 30 seconds — ABNORMAL HIGH (ref 11.6–15.2)

## 2016-01-21 LAB — ABO/RH: ABO/RH(D): A POS

## 2016-01-21 LAB — D-DIMER, QUANTITATIVE (NOT AT ARMC)

## 2016-01-21 MED ORDER — ATORVASTATIN CALCIUM 40 MG PO TABS
40.0000 mg | ORAL_TABLET | ORAL | Status: DC
  Administered 2016-01-22 – 2016-01-24 (×2): 40 mg via ORAL
  Filled 2016-01-21 (×2): qty 1

## 2016-01-21 MED ORDER — METOCLOPRAMIDE HCL 5 MG/ML IJ SOLN
5.0000 mg | Freq: Once | INTRAMUSCULAR | Status: AC
  Administered 2016-01-21: 5 mg via INTRAVENOUS
  Filled 2016-01-21: qty 2

## 2016-01-21 MED ORDER — ACETAMINOPHEN 325 MG PO TABS: 650.0000 mg | ORAL_TABLET | Freq: Four times a day (QID) | ORAL | Status: DC | PRN

## 2016-01-21 MED ORDER — CARVEDILOL 25 MG PO TABS
25.0000 mg | ORAL_TABLET | Freq: Two times a day (BID) | ORAL | Status: DC
  Administered 2016-01-21 – 2016-01-24 (×6): 25 mg via ORAL
  Filled 2016-01-21 (×6): qty 1

## 2016-01-21 MED ORDER — HYDROCODONE-ACETAMINOPHEN 5-325 MG PO TABS
1.0000 | ORAL_TABLET | Freq: Once | ORAL | Status: AC
  Administered 2016-01-21: 1 via ORAL
  Filled 2016-01-21: qty 1

## 2016-01-21 MED ORDER — LEVOTHYROXINE SODIUM 25 MCG PO TABS
125.0000 ug | ORAL_TABLET | Freq: Every day | ORAL | Status: DC
  Filled 2016-01-21: qty 1

## 2016-01-21 MED ORDER — LOSARTAN POTASSIUM 50 MG PO TABS
100.0000 mg | ORAL_TABLET | Freq: Every day | ORAL | Status: DC
  Administered 2016-01-22 – 2016-01-24 (×3): 100 mg via ORAL
  Filled 2016-01-21 (×3): qty 2

## 2016-01-21 MED ORDER — CALCIUM CARBONATE 1250 (500 CA) MG PO TABS
1250.0000 mg | ORAL_TABLET | Freq: Two times a day (BID) | ORAL | Status: DC
  Administered 2016-01-21 – 2016-01-24 (×6): 1250 mg via ORAL
  Filled 2016-01-21 (×6): qty 1

## 2016-01-21 MED ORDER — ACETAMINOPHEN 650 MG RE SUPP: 650.0000 mg | Freq: Four times a day (QID) | RECTAL | Status: DC | PRN

## 2016-01-21 MED ORDER — HYDROMORPHONE HCL 1 MG/ML IJ SOLN
0.5000 mg | Freq: Once | INTRAMUSCULAR | Status: AC
  Administered 2016-01-21: 0.5 mg via INTRAVENOUS
  Filled 2016-01-21: qty 1

## 2016-01-21 MED ORDER — FUROSEMIDE 10 MG/ML IJ SOLN
20.0000 mg | Freq: Once | INTRAMUSCULAR | Status: AC
  Administered 2016-01-21: 20 mg via INTRAVENOUS
  Filled 2016-01-21: qty 2

## 2016-01-21 MED ORDER — ONDANSETRON HCL 4 MG PO TABS: 4.0000 mg | ORAL_TABLET | Freq: Four times a day (QID) | ORAL | Status: DC | PRN

## 2016-01-21 MED ORDER — ONDANSETRON HCL 4 MG/2ML IJ SOLN
4.0000 mg | Freq: Four times a day (QID) | INTRAMUSCULAR | Status: DC | PRN
  Administered 2016-01-21 – 2016-01-22 (×2): 4 mg via INTRAVENOUS
  Filled 2016-01-21 (×2): qty 2

## 2016-01-21 MED ORDER — HYDROCODONE-ACETAMINOPHEN 5-325 MG PO TABS
1.0000 | ORAL_TABLET | ORAL | Status: DC | PRN
  Administered 2016-01-22 – 2016-01-24 (×2): 1 via ORAL
  Filled 2016-01-21 (×3): qty 1

## 2016-01-21 MED ORDER — SODIUM CHLORIDE 0.9% FLUSH
3.0000 mL | Freq: Two times a day (BID) | INTRAVENOUS | Status: DC
  Administered 2016-01-21 – 2016-01-22 (×2): 3 mL via INTRAVENOUS

## 2016-01-21 MED ORDER — OXYBUTYNIN CHLORIDE 5 MG PO TABS
5.0000 mg | ORAL_TABLET | Freq: Three times a day (TID) | ORAL | Status: DC
  Administered 2016-01-21 – 2016-01-24 (×8): 5 mg via ORAL
  Filled 2016-01-21 (×8): qty 1

## 2016-01-21 MED ORDER — HYDROMORPHONE HCL 1 MG/ML IJ SOLN: 1.0000 mg | INTRAMUSCULAR | Status: AC | PRN

## 2016-01-21 MED ORDER — SODIUM CHLORIDE 0.9 % IV SOLN
10.0000 mL/h | Freq: Once | INTRAVENOUS | Status: AC
  Administered 2016-01-21: 10 mL/h via INTRAVENOUS

## 2016-01-21 MED ORDER — MORPHINE SULFATE (PF) 2 MG/ML IV SOLN
1.0000 mg | INTRAVENOUS | Status: DC | PRN
Start: 2016-01-21 — End: 2016-01-24

## 2016-01-21 MED ORDER — SODIUM CHLORIDE 0.9 % IV SOLN
INTRAVENOUS | Status: DC
  Administered 2016-01-22: 11:00:00 via INTRAVENOUS

## 2016-01-21 NOTE — ED Notes (Signed)
Bed: WA01 Expected date:  Expected time:  Means of arrival:  Comments: 82 yo F, possible L rib fracture

## 2016-01-21 NOTE — ED Provider Notes (Signed)
[facility] DEPT Provider Note   CSN: [id number] Arrival date & time: 01/21/16  0901  First Provider Contact:  First MD Initiated Contact with Patient 01/21/16 1042        History   Chief Complaint No chief complaint on file.   HPI [name] is a 82 y.o. female who presents emergency Department with chief complaint of right-sided rib pain. Patient states that she was sleeping the night before last and heard a sudden pop on the left side. She's had severe pain in the left lower rib cage since that time. The pain is worse with movement or breathing. She states she was not coughing when it occurred. She has a chronic rash on that side, but denies anything new. She denies fevers or chills. She has a chronic cough.  HPI  Past Medical History:  Diagnosis Date  . Atrial fibrillation ([place])   . CAD (coronary artery disease) 06/2010 cath   Occluded distal RCA, moderately diffuse LCx and LAD disease out of proportion to LV dysfxn, and significant MR  . CHF (congestive heart failure) ([facility])    EF 20-25% and severe MR   . CKD (chronic kidney disease)    Baseline Cr 1.1-1.2   . CVA (cerebral infarction)    Left hemiparesis  . Hyperlipidemia   . Hypertension   . Hypothyroidism   . 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 Venous insufficiency    Chronic LE edema    Patient Active Problem List   Diagnosis Date Noted  . Atrial fibrillation ([place])   . CHF (congestive heart failure) ([place])     Past Surgical History:  Procedure Laterality Date  . CHOLECYSTECTOMY      OB History    No data available       Home Medications    Prior to Admission medications   Medication Sig Start Date End Date Taking? Authorizing Provider  aspirin 81 MG chewable tablet Chew 81 mg by mouth daily.    Historical Provider, MD  atorvastatin (LIPITOR) 80 MG tablet Take 40 mg by mouth every other day.     Historical Provider, MD  carvedilol (COREG) 25 MG tablet Take 25 mg by mouth 2  (two) times daily with a meal.    Historical Provider, MD  furosemide (LASIX) 40 MG tablet Take 40 mg by mouth daily.    Historical Provider, MD  levothyroxine (SYNTHROID, LEVOTHROID) 175 MCG tablet Take 175 mcg by mouth daily.    Historical Provider, MD  losartan (COZAAR) 100 MG tablet Take 100 mg by mouth daily.    Historical Provider, MD  oxybutynin (DITROPAN) 5 MG tablet Take 5 mg by mouth 3 (three) times daily. Bladder spasms    Historical Provider, MD  potassium chloride SA (K-DUR,KLOR-CON) 20 MEQ tablet Take 20 mEq by mouth daily.    Historical Provider, MD  warfarin (COUMADIN) 2 MG tablet Take 1-2 mg by mouth See admin instructions. Wednesdays and saturdays take 1 mg. All other days take 2mg .    Historical Provider, MD    Family History No family history on file.  Social History Social History  Substance Use Topics  . Smoking status: Former Research scientist (life sciences)  . Smokeless tobacco: Never Used     Comment: quit smoking 20-30 years ago  . Alcohol use No     Allergies   Avelox [moxifloxacin hcl in nacl]   Review of Systems Review of Systems Ten systems reviewed and are negative for acute change,  except as noted in the HPI.    Physical Exam Updated Vital Signs BP 125/73 (BP Location: Right Arm)   Pulse 104   Temp 97.8 F (36.6 C) (Oral)   Resp 20   SpO2 94%   Physical Exam  Constitutional: She is oriented to person, place, and time. She appears well-developed and well-nourished. No distress.  HENT:  Head: Normocephalic and atraumatic.  Eyes: Conjunctivae are normal. No scleral icterus.  Neck: Normal range of motion.  Cardiovascular: Normal rate, regular rhythm and normal heart sounds.  Exam reveals no gallop and no friction rub.   No murmur heard. Pulmonary/Chest: Effort normal and breath sounds normal. No respiratory distress. She exhibits tenderness and bony tenderness. She exhibits no crepitus, no edema, no deformity and no swelling.    Abdominal: Soft. Bowel sounds are  normal. She exhibits no distension and no mass. There is no tenderness. There is no guarding.  Neurological: She is alert and oriented to person, place, and time.  Tardive dyskinsia   Skin: Skin is warm and dry. She is not diaphoretic.  Multiple areas of excoriation on the Left side.   Nursing note and vitals reviewed.    ED Treatments / Results  Labs (all labs ordered are listed, but only abnormal results are displayed) Labs Reviewed - No data to display  EKG  EKG Interpretation None       Radiology No results found.  Procedures Procedures (including critical care time)  Medications Ordered in ED Medications - No data to display   Initial Impression / Assessment and Plan / ED Course  I have reviewed the triage vital signs and the nursing notes.  Pertinent labs & imaging results that were available during my care of the patient were reviewed by me and considered in my medical decision making (see chart for details).  Clinical Course  Value Comment By Time  DG Ribs Unilateral W/Chest Left Patient with Left sided rib pain.  Likely musculoskeletal. RASH does not appear to be shingles. Margarita Mail, PA-C 07/24 1054  Pulse Rate: 104 (Reviewed) Margarita Mail, PA-C 07/24 1152  SpO2: 95 % (Reviewed) Margarita Mail, PA-C 07/24 1152  SpO2: 94 % Patient cxr negative.  Pleuritic vs. Musculoskeletal pain. Tachychardic with decreased 02 sats.Will work up for cp/sob. Will obtain D-Dimer.  Margarita Mail, PA-C 07/24 1152  Hemoglobin: (!!) 6.5 Patient with low hgb, may explain her tachycardia. I have ordered a PT/INR, orthostatics, hemoccult. Margarita Mail, PA-C 07/24 1309  INR: (!) 3.55 Patient with supratherapeutic INR  Margarita Mail, PA-C 07/24 1459   Patient with 2 g drop in HGB.  She has consented for transfusion. Will need to hold coumadin for her elevated INR. Her hemoccult is negative. Unsure of etiology of her rib pain.  Pain addressed in the ED and improved. She will  need admission for blood transfusion. I have placed consult. Margarita Mail, PA-C 07/24 1528    3:42 PM BP 102/63   Pulse 95   Temp 97.8 F (36.6 C) (Oral)   Resp 25   SpO2 95%  Patient will be admitted By Dr. Hartford Poli to telemetry. Pt stable in ED with no significant deterioration in condition.   Final Clinical Impressions(s) / ED Diagnoses   Final diagnoses:  None    New Prescriptions New Prescriptions   No medications on file     Margarita Mail, PA-C 01/21/16 [place], MD 01/31/16 2213

## 2016-01-21 NOTE — ED Notes (Signed)
RN at bedside starting IV will complete orthostatic vital after

## 2016-01-21 NOTE — ED Triage Notes (Signed)
Per EMS report: pt coming from home and states that two nights ago pt head a "pop" while she was sleeping and now reports left rib pain.  Pt denies any injury or falls. Pt states she only has pain upon movement.  Pain is 10 out of 10 when pt is moving but otherwise, pt has no pain.  Hx. CVA, MI, Renal Failure. Pt took her morning medications but has not pain medication   EMS VS: BP: 124/96, HR: 104, RR: 18, 95% RA

## 2016-01-21 NOTE — H&P (Signed)
History and Physical    ABBOTT SARTOR [id number] DOB: Mar 09, 1930 DOA: 01/21/2016  PCP: Vena Austria, MD  Patient coming from: Home, seen with daughter and son-in-law at bedside  Chief Complaint: Left-sided chest pain  HPI: [name] is a 82 y.o. female with medical history significant of atrial fibrillation on Coumadin, CAD and history of chronic systolic CHF. She came to the hospital because of left sided chest wall pain. According to her daughter at bedside she reported that she heard a pop last night when she was in bed and she felt moderate to severe pain on the left chest wall. She came in to the ED for further evaluation. She denies any falls, denies any cough, fever or palpitations. Evaluated by x-ray didn't show broken ribs. She was found to supratherapeutic INR and anemia but negative FOBT.  ED Course:  Vitals: WNL Labs: Creatinine is 1.34 (baseline) Imaging: Hemoglobin is 6.5, no recent baseline Interventions: Given 1 unit of blood, pain controlled.  Review of Systems:  Constitutional: Generalized weakness Eyes: negative for irritation, redness and visual disturbance Ears, nose, mouth, throat, and face: negative for earaches, epistaxis, nasal congestion and sore throat Respiratory: negative for cough, dyspnea on exertion, sputum and wheezing Cardiovascular: negative for chest pain, dyspnea, lower extremity edema, orthopnea, palpitations and syncope Gastrointestinal: negative for abdominal pain, constipation, diarrhea, melena, nausea and vomiting Genitourinary:negative for dysuria, frequency and hematuria Hematologic/lymphatic: negative for bleeding, easy bruising and lymphadenopathy Musculoskeletal: Left-sided chest wall pain Neurological: negative for coordination problems, gait problems, headaches and weakness Endocrine: negative for diabetic symptoms including polydipsia, polyuria and weight loss Allergic/Immunologic: negative for anaphylaxis, hay fever  and urticaria  Past Medical History:  Diagnosis Date  . Atrial fibrillation ([place])   . CAD (coronary artery disease) 06/2010 cath   Occluded distal RCA, moderately diffuse LCx and LAD disease out of proportion to LV dysfxn, and significant MR  . CHF (congestive heart failure) (HCC)    EF 20-25% and severe MR   . CKD (chronic kidney disease)    Baseline Cr 1.1-1.2   . CVA (cerebral infarction)    Left hemiparesis  . Hyperlipidemia   . Hypertension   . Hypothyroidism   . 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 Venous insufficiency    Chronic LE edema    Past Surgical History:  Procedure Laterality Date  . CHOLECYSTECTOMY       reports that she has quit smoking. She has never used smokeless tobacco. She reports that she does not drink alcohol or use drugs.  Allergies  Allergen Reactions  . Avelox [Moxifloxacin Hcl In Nacl] Other (See Comments)    "didn't know what was going on, lost 3 days"  . Doxycycline Hives, Itching and Rash   Family history: Noncontributory  Prior to Admission medications   Medication Sig Start Date End Date Taking? Authorizing Provider  alendronate (FOSAMAX) 70 MG tablet Take 70 mg by mouth once a week. thursday 01/07/16  Yes Historical Provider, MD  atorvastatin (LIPITOR) 80 MG tablet Take 40 mg by mouth every other day.    Yes Historical Provider, MD  calcium carbonate (OSCAL) 1500 (600 Ca) MG TABS tablet Take 1,500 mg by mouth 2 (two) times daily with a meal.   Yes Historical Provider, MD  carvedilol (COREG) 25 MG tablet Take 25 mg by mouth 2 (two) times daily with a meal.   Yes Historical Provider, MD  furosemide (LASIX) 40 MG tablet Take 40 mg by mouth  daily.   Yes Historical Provider, MD  levothyroxine (SYNTHROID, LEVOTHROID) 125 MCG tablet Take 125 mcg by mouth daily before breakfast.   Yes Historical Provider, MD  losartan (COZAAR) 100 MG tablet Take 100 mg by mouth daily.   Yes Historical Provider, MD  LUTEIN-ZEAXANTHIN PO Take 1  tablet by mouth 2 (two) times daily.   Yes Historical Provider, MD  oxybutynin (DITROPAN) 5 MG tablet Take 5 mg by mouth 3 (three) times daily. Bladder spasms   Yes Historical Provider, MD  potassium chloride SA (K-DUR,KLOR-CON) 20 MEQ tablet Take 20 mEq by mouth daily.   Yes Historical Provider, MD  warfarin (COUMADIN) 1 MG tablet Take 1 mg by mouth as directed. Take 1mg  by mouth once daily on Tuesday, Thursday, and Saturdays   Yes Historical Provider, MD  warfarin (COUMADIN) 2 MG tablet Take 2 mg by mouth as directed. Take 2mg  by mouth once daily on Sunday, Monday, Wednesdays and Friday   Yes Historical Provider, MD    Physical Exam:  Vitals:   01/21/16 1325 01/21/16 1534 01/21/16 1604 01/21/16 1620  BP: 119/76 102/63 114/78 126/73  Pulse: 103 95 89 86  Resp: 24 25  14   Temp:   97.6 F (36.4 C) 98 F (36.7 C)  TempSrc:   Oral Oral  SpO2: 96% 95% 92%     Constitutional: NAD, calm, comfortable Eyes: PERRL, lids and conjunctivae normal ENMT: Mucous membranes are moist. Posterior pharynx clear of any exudate or lesions.Normal dentition.  Neck: normal, supple, no masses, no thyromegaly Respiratory: clear to auscultation bilaterally, no wheezing, no crackles. Normal respiratory effort. No accessory muscle use.  Cardiovascular: Regular rate and rhythm, no murmurs / rubs / gallops. No extremity edema. 2+ pedal pulses. No carotid bruits.  Abdomen: no tenderness, no masses palpated. No hepatosplenomegaly. Bowel sounds positive.  Musculoskeletal: no clubbing / cyanosis. No joint deformity upper and lower extremities. Good ROM, no contractures. Normal muscle tone.  Skin: no rashes, lesions, ulcers. No induration Neurologic: CN 2-12 grossly intact. Sensation intact, DTR normal. Strength 5/5 in all 4.  Psychiatric: Normal judgment and insight. Alert and oriented x 3. Normal mood.   Labs on Admission: I have personally reviewed following labs and imaging studies  CBC:  Recent Labs Lab  01/21/16 1154  WBC 5.7  NEUTROABS 3.9  HGB 6.5*  HCT 24.3*  MCV 71.7*  PLT 123XX123   Basic Metabolic Panel:  Recent Labs Lab 01/21/16 1154  NA 139  K 4.0  CL 108  CO2 25  GLUCOSE 108*  BUN 19  CREATININE 1.34*  CALCIUM 9.5   GFR: CrCl cannot be calculated (Unknown ideal weight.). Liver Function Tests: No results for input(s): AST, ALT, ALKPHOS, BILITOT, PROT, ALBUMIN in the last 168 hours. No results for input(s): LIPASE, AMYLASE in the last 168 hours. No results for input(s): AMMONIA in the last 168 hours. Coagulation Profile:  Recent Labs Lab 01/21/16 1154  INR 3.55*   Cardiac Enzymes: No results for input(s): CKTOTAL, CKMB, CKMBINDEX, TROPONINI in the last 168 hours. BNP (last 3 results) No results for input(s): PROBNP in the last 8760 hours. HbA1C: No results for input(s): HGBA1C in the last 72 hours. CBG: No results for input(s): GLUCAP in the last 168 hours. Lipid Profile: No results for input(s): CHOL, HDL, LDLCALC, TRIG, CHOLHDL, LDLDIRECT in the last 72 hours. Thyroid Function Tests: No results for input(s): TSH, T4TOTAL, FREET4, T3FREE, THYROIDAB in the last 72 hours. Anemia Panel: No results for input(s): VITAMINB12, FOLATE, FERRITIN, TIBC,  IRON, RETICCTPCT in the last 72 hours. Urine analysis:    Component Value Date/Time   COLORURINE YELLOW 12/12/2011 2221   APPEARANCEUR CLOUDY (A) 12/12/2011 2221   LABSPEC 1.018 12/12/2011 2221   PHURINE 5.5 12/12/2011 2221   GLUCOSEU NEGATIVE 12/12/2011 2221   HGBUR LARGE (A) 12/12/2011 2221   BILIRUBINUR NEGATIVE 12/12/2011 2221   KETONESUR NEGATIVE 12/12/2011 2221   PROTEINUR NEGATIVE 12/12/2011 2221   UROBILINOGEN 0.2 12/12/2011 2221   NITRITE POSITIVE (A) 12/12/2011 2221   LEUKOCYTESUR LARGE (A) 12/12/2011 2221   Sepsis Labs: !!!!!!!!!!!!!!!!!!!!!!!!!!!!!!!!!!!!!!!!!!!! Invalid input(s): PROCALCITONIN, LACTICIDVEN No results found for this or any previous visit (from the past 240 hour(s)).    Radiological Exams on Admission: Dg Ribs Unilateral W/chest Left  Result Date: 01/21/2016 CLINICAL DATA:  82 year old female felt pop when lying down left rib region. Pain worse with motion. Initial encounter. EXAM: LEFT RIBS AND CHEST - 3+ VIEW COMPARISON:  10/17/2014 and 12/12/2011 chest x-ray. FINDINGS: No left-sided rib fracture or pneumothorax detected. Degenerative changes acromioclavicular joint and throughout the thoracic spine. Cardiomegaly. Pulmonary vascular prominence most notable centrally. Chronic interstitial markings similar to prior exam. Moderate-size hiatal hernia. Right-sided pleural thickening. Calcified ectatic aorta. Tracheal displacement by ectatic aorta. IMPRESSION: No left-sided rib fracture or pneumothorax noted. Cardiomegaly. Pulmonary vascular prominence most notable centrally. Chronic interstitial markings similar to prior exam. Moderate-size hiatal hernia. Aortic atherosclerosis. Electronically Signed   By: Genia Del M.D.   On: 01/21/2016 11:21   EKG: Independently reviewed.   Assessment/Plan Active Problems:   Atrial fibrillation (HCC)   Chronic systolic CHF (congestive heart failure) (HCC)   Anemia   Left-sided chest wall pain   CKD (chronic kidney disease), stage III   Hypothyroidism    Anemia -Microcytic anemia, presented with hemoglobin of 6.5 and MCV of 71. -No recent labs to compare. We will assume it is acute versus chronic blood loss anemia due to to supratherapeutic INR. -FOBT is negative, we will repeated. -Patient will receive 2 units of blood transfusion with 20 mg of Lasix. -Check CBC in a.m. If FOBT positive will call GI in a.m., keep nothing by mouth past midnight.  Left-sided chest wall pain -Ribs x-ray showed no evidence of fractures, patient still complaining about severe pain. -We'll obtain CT of the chest without contrast. -I will control pain with low-dose opioid narcotics.  Atrial fibrillation -Controlled rate, continue  home medications. -CHA2DS2-VASc at least 3, on Coumadin, presented with INR of 3.55. -Hold Coumadin and check INR daily.  Chronic diastolic CHF -This is obtained from the records, patient appears to have no symptoms of CHF currently.  CKD stage III -Creatinine is 1.34, appears to be at baseline. Patient does not seem to be dehydrated.  Hypothyroidism -Continue home dose of Synthroid, check TSH.  DVT prophylaxis: SCDs Code Status: Full code Family Communication: Plan D/W patient in the presence of her daughter and son low at bedside. Disposition Plan: To be determined Consults called: Call GI in a.m. if FOBT is positive Admission status: Inpatient, telemetry   [place] A MD [facility] Pager [phone]  If 7PM-7AM, please contact night-coverage [email or web address] Password [facility]  01/21/2016, 4:39 PM

## 2016-01-21 NOTE — ED Notes (Signed)
2 unsucessful IV attempts by this RN.  Another RN asked to try.

## 2016-01-21 NOTE — ED Notes (Signed)
Pt unable to turn for POC occult.  MD aware.

## 2016-01-22 DIAGNOSIS — I5022 Chronic systolic (congestive) heart failure: Secondary | ICD-10-CM | POA: Diagnosis not present

## 2016-01-22 DIAGNOSIS — D509 Iron deficiency anemia, unspecified: Secondary | ICD-10-CM | POA: Diagnosis not present

## 2016-01-22 DIAGNOSIS — I482 Chronic atrial fibrillation: Secondary | ICD-10-CM | POA: Diagnosis not present

## 2016-01-22 DIAGNOSIS — R0789 Other chest pain: Secondary | ICD-10-CM | POA: Diagnosis not present

## 2016-01-22 DIAGNOSIS — E039 Hypothyroidism, unspecified: Secondary | ICD-10-CM | POA: Diagnosis not present

## 2016-01-22 DIAGNOSIS — N183 Chronic kidney disease, stage 3 (moderate): Secondary | ICD-10-CM | POA: Diagnosis not present

## 2016-01-22 LAB — BASIC METABOLIC PANEL
Anion gap: 7 (ref 5–15)
BUN: 20 mg/dL (ref 6–20)
CALCIUM: 10.3 mg/dL (ref 8.9–10.3)
CHLORIDE: 105 mmol/L (ref 101–111)
CO2: 28 mmol/L (ref 22–32)
CREATININE: 1.27 mg/dL — AB (ref 0.44–1.00)
GFR, EST AFRICAN AMERICAN: 44 mL/min — AB (ref >60)
GFR, EST NON AFRICAN AMERICAN: 38 mL/min — AB (ref >60)
Glucose, Bld: 104 mg/dL — ABNORMAL HIGH (ref 65–99)
Potassium: 3.8 mmol/L (ref 3.5–5.1)
SODIUM: 140 mmol/L (ref 135–145)

## 2016-01-22 LAB — CBC
HCT: 29.3 % — ABNORMAL LOW (ref 36.0–46.0)
Hemoglobin: 9 g/dL — ABNORMAL LOW (ref 12.0–15.0)
MCH: 22.2 pg — ABNORMAL LOW (ref 26.0–34.0)
MCHC: 30.7 g/dL (ref 30.0–36.0)
MCV: 72.3 fL — AB (ref 78.0–100.0)
PLATELETS: 210 10*3/uL (ref 150–400)
RBC: 4.05 MIL/uL (ref 3.87–5.11)
RDW: 20 % — AB (ref 11.5–15.5)
WBC: 5.6 10*3/uL (ref 4.0–10.5)

## 2016-01-22 LAB — TSH: TSH: 11.783 u[IU]/mL — AB (ref 0.350–4.500)

## 2016-01-22 MED ORDER — LEVOTHYROXINE SODIUM 25 MCG PO TABS
137.0000 ug | ORAL_TABLET | Freq: Every day | ORAL | Status: DC
  Administered 2016-01-23 – 2016-01-24 (×2): 137 ug via ORAL
  Filled 2016-01-22 (×2): qty 1

## 2016-01-22 NOTE — Progress Notes (Signed)
PROGRESS NOTE  [name]  [id number] DOB: 08/28/1929 DOA: 01/21/2016 PCP: Vena Austria, MD Outpatient Specialists:  Subjective: Feels okay this morning, denies any complaints. Status post transfusion of 2 units, hemoglobin is 9 today.  Brief Narrative:  82 year old female with past medical history of atrial fibrillation, on Coumadin anticoagulation, chronic systolic CHF and hypothyroidism who came into the hospital complaining about left-sided chest pain. In the emergency department she was found to have hemoglobin of 6.5 and INR of 3.55  Assessment & Plan:   Active Problems:   Atrial fibrillation (HCC)   Chronic systolic CHF (congestive heart failure) (HCC)   Anemia   Left-sided chest wall pain   CKD (chronic kidney disease), stage III   Hypothyroidism   Anemia -Microcytic anemia, presented with hemoglobin of 6.5 and MCV of 71. -No recent labs to compare. We will assume it is acute versus chronic blood loss anemia due to to supratherapeutic INR. -FOBT is negative, we will repeat it. -Status post transfusion of 2 units of packed RBCs, hemoglobin improved to 9.0 -Repeat FOBT not done as patient did not have bowel movement till now, will ask GI to evaluate her.  Left-sided chest wall pain -Ribs x-ray showed no evidence of fractures, patient still complaining about severe pain. -CT scan showed no acute chest findings,? Could be secondary to degenerative spine arthritis. -I will control pain with low-dose opioid narcotics.  Atrial fibrillation -Controlled rate, continue home medications. -CHA2DS2-VASc at least 3, on Coumadin, presented with INR of 3.55. -Hold Coumadin and INR today is 2.9, she took Coumadin for such long time, still can consider change to Eliquis.  Chronic diastolic CHF -This is obtained from the records, patient appears to have no symptoms of CHF currently.  CKD stage III -Creatinine is 1.34, appears to be at baseline. Patient does not  seem to be dehydrated.  Hypothyroidism -Continue home dose of Synthroid, check TSH.   DVT prophylaxis:  Code Status: Full Code Family Communication:  Disposition Plan:  Diet: Diet NPO time specified  Consultants:   GI  Procedures:   None  Antimicrobials:   None   Objective: Vitals:   01/21/16 2145 01/22/16 0050 01/22/16 0122 01/22/16 0437  BP: (!) 122/97 (!) 141/69 (!) 139/95 (!) 130/98  Pulse: 65 96 (!) 111 99  Resp: 20 (!) 22 20 18   Temp: 98.2 F (36.8 C) 97.5 F (36.4 C) 98.5 F (36.9 C) 98 F (36.7 C)  TempSrc: Oral Oral Oral Oral  SpO2: 98% 96% 97% 94%  Weight:      Height:        Intake/Output Summary (Last 24 hours) at 01/22/16 1056 Last data filed at 01/22/16 0432  Gross per 24 hour  Intake              700 ml  Output                0 ml  Net              700 ml   Filed Weights   01/21/16 1721  Weight: 102.2 kg (225 lb 5 oz)    Examination: General exam: Appears calm and comfortable  Respiratory system: Clear to auscultation. Respiratory effort normal. Cardiovascular system: S1 & S2 heard, RRR. No JVD, murmurs, rubs, gallops or clicks. No pedal edema. Gastrointestinal system: Abdomen is nondistended, soft and nontender. No organomegaly or masses felt. Normal bowel sounds heard. Central nervous system: Alert and oriented. No focal neurological deficits. Extremities: Symmetric 5 x  5 power. Skin: No rashes, lesions or ulcers Psychiatry: Judgement and insight appear normal. Mood & affect appropriate.   Data Reviewed: I have personally reviewed following labs and imaging studies  CBC:  Recent Labs Lab 01/21/16 1154 01/22/16 0813  WBC 5.7 5.6  NEUTROABS 3.9  --   HGB 6.5* 9.0*  HCT 24.3* 29.3*  MCV 71.7* 72.3*  PLT 208 [id number]   Basic Metabolic Panel:  Recent Labs Lab 01/21/16 1154 01/22/16 0813  NA 139 140  K 4.0 3.8  CL 108 105  CO2 25 28  GLUCOSE 108* 104*  BUN 19 20  CREATININE 1.34* 1.27*  CALCIUM 9.5 10.3    GFR: Estimated Creatinine Clearance: 39.1 mL/min (by C-G formula based on SCr of 1.27 mg/dL). Liver Function Tests: No results for input(s): AST, ALT, ALKPHOS, BILITOT, PROT, ALBUMIN in the last 168 hours. No results for input(s): LIPASE, AMYLASE in the last 168 hours. No results for input(s): AMMONIA in the last 168 hours. Coagulation Profile:  Recent Labs Lab 01/21/16 1154 01/21/16 2314  INR 3.55* 2.92*   Cardiac Enzymes: No results for input(s): CKTOTAL, CKMB, CKMBINDEX, TROPONINI in the last 168 hours. BNP (last 3 results) No results for input(s): PROBNP in the last 8760 hours. HbA1C: No results for input(s): HGBA1C in the last 72 hours. CBG: No results for input(s): GLUCAP in the last 168 hours. Lipid Profile: No results for input(s): CHOL, HDL, LDLCALC, TRIG, CHOLHDL, LDLDIRECT in the last 72 hours. Thyroid Function Tests:  Recent Labs  01/21/16 2314  TSH 11.783*   Anemia Panel: No results for input(s): VITAMINB12, FOLATE, FERRITIN, TIBC, IRON, RETICCTPCT in the last 72 hours. Urine analysis:    Component Value Date/Time   COLORURINE YELLOW 12/12/2011 2221   APPEARANCEUR CLOUDY (A) 12/12/2011 2221   LABSPEC 1.018 12/12/2011 2221   PHURINE 5.5 12/12/2011 2221   GLUCOSEU NEGATIVE 12/12/2011 2221   HGBUR LARGE (A) 12/12/2011 2221   BILIRUBINUR NEGATIVE 12/12/2011 2221   KETONESUR NEGATIVE 12/12/2011 2221   PROTEINUR NEGATIVE 12/12/2011 2221   UROBILINOGEN 0.2 12/12/2011 2221   NITRITE POSITIVE (A) 12/12/2011 2221   LEUKOCYTESUR LARGE (A) 12/12/2011 2221   Sepsis Labs: @LABRCNTIP (procalcitonin:4,lacticidven:4)  )No results found for this or any previous visit (from the past 240 hour(s)).   Invalid input(s): PROCALCITONIN, LACTICACIDVEN   Radiology Studies: Dg Ribs Unilateral W/chest Left  Result Date: 01/21/2016 CLINICAL DATA:  82 year old female felt pop when lying down left rib region. Pain worse with motion. Initial encounter. EXAM: LEFT RIBS AND  CHEST - 3+ VIEW COMPARISON:  10/17/2014 and 12/12/2011 chest x-ray. FINDINGS: No left-sided rib fracture or pneumothorax detected. Degenerative changes acromioclavicular joint and throughout the thoracic spine. Cardiomegaly. Pulmonary vascular prominence most notable centrally. Chronic interstitial markings similar to prior exam. Moderate-size hiatal hernia. Right-sided pleural thickening. Calcified ectatic aorta. Tracheal displacement by ectatic aorta. IMPRESSION: No left-sided rib fracture or pneumothorax noted. Cardiomegaly. Pulmonary vascular prominence most notable centrally. Chronic interstitial markings similar to prior exam. Moderate-size hiatal hernia. Aortic atherosclerosis. Electronically Signed   By: Genia Del M.D.   On: 01/21/2016 11:21  Ct Chest Wo Contrast  Result Date: 01/21/2016 CLINICAL DATA:  Left-sided chest pain EXAM: CT CHEST WITHOUT CONTRAST TECHNIQUE: Multidetector CT imaging of the chest was performed following the standard protocol without IV contrast. COMPARISON:  None. FINDINGS: Cardiovascular: Aortic calcifications are identified without aneurysmal dilatation. Heavy coronary calcifications are seen. Cardiac shadow is mildly enlarged. Mediastinum/Nodes: No significant lymphadenopathy is identified. Thoracic inlet is within normal limits. Lungs/Pleura: Lungs  are well aerated bilaterally. Minimal interstitial thickening is noted likely of a chronic nature. No focal infiltrate or sizable effusion is seen. A few tiny subpleural nodules are noted. The largest of these is seen on image number 60 of series 5 measuring 4 mm. Upper Abdomen: Large hiatal hernia is noted. The visualized upper abdomen shows changes of prior cholecystectomy. Musculoskeletal: Degenerative changes of the thoracic spine are noted. No other bony abnormality is seen. IMPRESSION: Chronic changes as described above. Scattered small less than 5 mm subpleural nodules. No follow-up needed if patient is low-risk (and has  no known or suspected primary neoplasm). Non-contrast chest CT can be considered in 12 months if patient is high-risk. This recommendation follows the consensus statement: Guidelines for Management of Incidental Pulmonary Nodules Detected on CT Images:From the Fleischner Society 2017; published online before print (10.1148/radiol.IJ:2314499). Electronically Signed   By: Inez Catalina M.D.   On: 01/21/2016 19:41       Scheduled Meds: . atorvastatin  40 mg Oral QODAY  . calcium carbonate  1,250 mg Oral BID WC  . carvedilol  25 mg Oral BID WC  . [START ON 01/23/2016] levothyroxine  137 mcg Oral QAC breakfast  . losartan  100 mg Oral Daily  . oxybutynin  5 mg Oral TID  . sodium chloride flush  3 mL Intravenous Q12H   Continuous Infusions: . sodium chloride       LOS: 1 day    Time spent: 35 minutes    Ewin Rehberg A, MD [facility] Pager [phone]  If 7PM-7AM, please contact night-coverage [email or web address] Password [facility] 01/22/2016, 10:56 AM

## 2016-01-23 ENCOUNTER — Inpatient Hospital Stay (HOSPITAL_COMMUNITY): Payer: Medicare Other

## 2016-01-23 DIAGNOSIS — D649 Anemia, unspecified: Secondary | ICD-10-CM | POA: Diagnosis not present

## 2016-01-23 DIAGNOSIS — N183 Chronic kidney disease, stage 3 (moderate): Secondary | ICD-10-CM

## 2016-01-23 DIAGNOSIS — R0789 Other chest pain: Secondary | ICD-10-CM | POA: Diagnosis not present

## 2016-01-23 DIAGNOSIS — I5022 Chronic systolic (congestive) heart failure: Secondary | ICD-10-CM | POA: Diagnosis not present

## 2016-01-23 DIAGNOSIS — E039 Hypothyroidism, unspecified: Secondary | ICD-10-CM | POA: Diagnosis not present

## 2016-01-23 DIAGNOSIS — I482 Chronic atrial fibrillation: Secondary | ICD-10-CM

## 2016-01-23 DIAGNOSIS — D509 Iron deficiency anemia, unspecified: Secondary | ICD-10-CM | POA: Diagnosis not present

## 2016-01-23 LAB — BASIC METABOLIC PANEL
Anion gap: 7 (ref 5–15)
BUN: 21 mg/dL — ABNORMAL HIGH (ref 6–20)
CALCIUM: 10.9 mg/dL — AB (ref 8.9–10.3)
CO2: 29 mmol/L (ref 22–32)
CREATININE: 1.41 mg/dL — AB (ref 0.44–1.00)
Chloride: 104 mmol/L (ref 101–111)
GFR calc non Af Amer: 33 mL/min — ABNORMAL LOW (ref >60)
GFR, EST AFRICAN AMERICAN: 38 mL/min — AB (ref >60)
Glucose, Bld: 126 mg/dL — ABNORMAL HIGH (ref 65–99)
Potassium: 3.5 mmol/L (ref 3.5–5.1)
SODIUM: 140 mmol/L (ref 135–145)

## 2016-01-23 LAB — CBC
HCT: 31.1 % — ABNORMAL LOW (ref 36.0–46.0)
Hemoglobin: 9.4 g/dL — ABNORMAL LOW (ref 12.0–15.0)
MCH: 22 pg — AB (ref 26.0–34.0)
MCHC: 30.2 g/dL (ref 30.0–36.0)
MCV: 72.7 fL — ABNORMAL LOW (ref 78.0–100.0)
PLATELETS: 219 10*3/uL (ref 150–400)
RBC: 4.28 MIL/uL (ref 3.87–5.11)
RDW: 21 % — ABNORMAL HIGH (ref 11.5–15.5)
WBC: 6.4 10*3/uL (ref 4.0–10.5)

## 2016-01-23 LAB — TYPE AND SCREEN
ABO/RH(D): A POS
Antibody Screen: NEGATIVE
Unit division: 0
Unit division: 0

## 2016-01-23 LAB — PROTIME-INR
INR: 2.31
PROTHROMBIN TIME: 25.7 s — AB (ref 11.4–15.2)

## 2016-01-23 LAB — HEMOGLOBIN A1C
HEMOGLOBIN A1C: 5.7 % — AB (ref 4.8–5.6)
MEAN PLASMA GLUCOSE: 117 mg/dL

## 2016-01-23 MED ORDER — SENNOSIDES-DOCUSATE SODIUM 8.6-50 MG PO TABS
2.0000 | ORAL_TABLET | Freq: Every day | ORAL | Status: DC
  Administered 2016-01-23: 2 via ORAL
  Filled 2016-01-23: qty 2

## 2016-01-23 MED ORDER — WARFARIN SODIUM 2 MG PO TABS
2.0000 mg | ORAL_TABLET | Freq: Once | ORAL | Status: AC
  Administered 2016-01-23: 2 mg via ORAL
  Filled 2016-01-23: qty 1

## 2016-01-23 MED ORDER — WARFARIN - PHARMACIST DOSING INPATIENT: Freq: Every day | Status: DC

## 2016-01-23 MED ORDER — DIATRIZOATE MEGLUMINE & SODIUM 66-10 % PO SOLN
15.0000 mL | ORAL | Status: DC | PRN
  Filled 2016-01-23: qty 30

## 2016-01-23 NOTE — Progress Notes (Signed)
PROGRESS NOTE  [name] [id number] DOB: 25-Sep-1929 DOA: 01/21/2016 PCP: Vena Austria, MD   LOS: 2 days   Brief Narrative: 82 year old female with past medical history of atrial fibrillation, on Coumadin anticoagulation, chronic systolic CHF and hypothyroidism who came into the hospital complaining about left-sided chest pain. In the emergency department she was found to have hemoglobin of 6.5 and INR of 3.55  Assessment & Plan: Active Problems:   Atrial fibrillation (HCC)   Chronic systolic CHF (congestive heart failure) (HCC)   Anemia   Left-sided chest wall pain   CKD (chronic kidney disease), stage III   Hypothyroidism   Anemia - Microcytic anemia, presented with hemoglobin of 6.5 and MCV of 71. - No recent labs to compare. We will assume it is acute versus chronic blood loss anemia due to to supratherapeutic INR. - FOBT is negative, we will repeat it, has not had a BM yet. If repeat FOBT positive, will consult GI - Status post transfusion of 2 units of packed RBCs, hemoglobin improved to 9.0 and remained stable - Dr. Hartford Poli discussed with Dr. Michail Sermon, no active bleed she can be worked up as an outpatient.  - describes more of a back / flank pain on admission, denies chest pain. Obtain CT abdomen pelvis today  Left-sided chest wall pain  - today denies this stating that has back / flank pain on left - Ribs x-ray showed no evidence of fractures, patient still complaining about severe pain. - CT scan showed no acute chest findings,? Could be secondary to degenerative spine arthritis. - I will control pain with low-dose opioid narcotics.  Atrial fibrillation - Controlled rate, continue home medications. - CHA2DS2-VAScat least 3, on Coumadin, presented with INR of 3.55. - hold Coumadin until sorting out ?bleed vs chronic anemia  Chronic diastolic CHF - This is obtained from the records, patient appears to have no symptoms of CHF currently.  CKD stage  III - Creatinine is 1.4, appears to be at baseline. Patient does not seem to be dehydrated.  Hypothyroidism - Continue home dose of Synthroid - TSH 11. Will need to be repeated in 2-3 weeks as an outpatient   DVT prophylaxis: Coumadin Code Status: Full Family Communication: no family bedside Disposition Plan: home when ready  Consultants:   None   Procedures:   None   Antimicrobials:  None    Subjective: - no chest pain, shortness of breath, no abdominal pain, nausea or vomiting. + flank pain  Objective: Vitals:   01/22/16 0437 01/22/16 1514 01/22/16 2052 01/23/16 0533  BP: (!) 130/98 140/62 118/68 120/65  Pulse: 99 97 88 87  Resp: 18 20 20 20   Temp: 98 F (36.7 C) 97.9 F (36.6 C) 98 F (36.7 C) 98.3 F (36.8 C)  TempSrc: Oral Oral Oral Oral  SpO2: 94% 92% 90% 98%  Weight:      Height:        Intake/Output Summary (Last 24 hours) at 01/23/16 1121 Last data filed at 01/22/16 1905  Gross per 24 hour  Intake           322.83 ml  Output                0 ml  Net           322.83 ml   Filed Weights   01/21/16 1721  Weight: 102.2 kg (225 lb 5 oz)    Examination: Constitutional: NAD Vitals:   01/22/16 [id number] 01/22/16 1514 01/22/16 2052 01/23/16 0533  BP: (!) 130/98 140/62 118/68 120/65  Pulse: 99 97 88 87  Resp: 18 20 20 20   Temp: 98 F (36.7 C) 97.9 F (36.6 C) 98 F (36.7 C) 98.3 F (36.8 C)  TempSrc: Oral Oral Oral Oral  SpO2: 94% 92% 90% 98%  Weight:      Height:       Eyes: PERRL, lids and conjunctivae normal Respiratory: clear to auscultation bilaterally, no wheezing, no crackles.  Cardiovascular: Regular rate and rhythm, no murmurs / rubs / gallops.  Abdomen: no tenderness. Bowel sounds positive.  Musculoskeletal: no clubbing / cyanosis. Neurologic: non focal   Data Reviewed: I have personally reviewed following labs and imaging studies  CBC:  Recent Labs Lab 01/21/16 1154 01/22/16 0813 01/23/16 0452  WBC 5.7 5.6 6.4  NEUTROABS  3.9  --   --   HGB 6.5* 9.0* 9.4*  HCT 24.3* 29.3* 31.1*  MCV 71.7* 72.3* 72.7*  PLT 208 210 [id number]   Basic Metabolic Panel:  Recent Labs Lab 01/21/16 1154 01/22/16 0813 01/23/16 0452  NA 139 140 140  K 4.0 3.8 3.5  CL 108 105 104  CO2 25 28 29   GLUCOSE 108* 104* 126*  BUN 19 20 21*  CREATININE 1.34* 1.27* 1.41*  CALCIUM 9.5 10.3 10.9*   GFR: Estimated Creatinine Clearance: 35.2 mL/min (by C-G formula based on SCr of 1.41 mg/dL). Liver Function Tests: No results for input(s): AST, ALT, ALKPHOS, BILITOT, PROT, ALBUMIN in the last 168 hours. No results for input(s): LIPASE, AMYLASE in the last 168 hours. No results for input(s): AMMONIA in the last 168 hours. Coagulation Profile:  Recent Labs Lab 01/21/16 1154 01/21/16 2314  INR 3.55* 2.92*   Cardiac Enzymes: No results for input(s): CKTOTAL, CKMB, CKMBINDEX, TROPONINI in the last 168 hours. BNP (last 3 results) No results for input(s): PROBNP in the last 8760 hours. HbA1C:  Recent Labs  01/21/16 2314  HGBA1C 5.7*   CBG: No results for input(s): GLUCAP in the last 168 hours. Lipid Profile: No results for input(s): CHOL, HDL, LDLCALC, TRIG, CHOLHDL, LDLDIRECT in the last 72 hours. Thyroid Function Tests:  Recent Labs  01/21/16 2314  TSH 11.783*   Anemia Panel: No results for input(s): VITAMINB12, FOLATE, FERRITIN, TIBC, IRON, RETICCTPCT in the last 72 hours. Urine analysis:    Component Value Date/Time   COLORURINE YELLOW 12/12/2011 2221   APPEARANCEUR CLOUDY (A) 12/12/2011 2221   LABSPEC 1.018 12/12/2011 2221   PHURINE 5.5 12/12/2011 2221   GLUCOSEU NEGATIVE 12/12/2011 2221   HGBUR LARGE (A) 12/12/2011 2221   BILIRUBINUR NEGATIVE 12/12/2011 2221   KETONESUR NEGATIVE 12/12/2011 2221   PROTEINUR NEGATIVE 12/12/2011 2221   UROBILINOGEN 0.2 12/12/2011 2221   NITRITE POSITIVE (A) 12/12/2011 2221   LEUKOCYTESUR LARGE (A) 12/12/2011 2221   Sepsis Labs: Invalid input(s): PROCALCITONIN, LACTICIDVEN  No  results found for this or any previous visit (from the past 240 hour(s)).    Radiology Studies: Ct Chest Wo Contrast  Result Date: 01/21/2016 CLINICAL DATA:  Left-sided chest pain EXAM: CT CHEST WITHOUT CONTRAST TECHNIQUE: Multidetector CT imaging of the chest was performed following the standard protocol without IV contrast. COMPARISON:  None. FINDINGS: Cardiovascular: Aortic calcifications are identified without aneurysmal dilatation. Heavy coronary calcifications are seen. Cardiac shadow is mildly enlarged. Mediastinum/Nodes: No significant lymphadenopathy is identified. Thoracic inlet is within normal limits. Lungs/Pleura: Lungs are well aerated bilaterally. Minimal interstitial thickening is noted likely of a chronic nature. No focal infiltrate or sizable effusion is seen. A few  tiny subpleural nodules are noted. The largest of these is seen on image number 60 of series 5 measuring 4 mm. Upper Abdomen: Large hiatal hernia is noted. The visualized upper abdomen shows changes of prior cholecystectomy. Musculoskeletal: Degenerative changes of the thoracic spine are noted. No other bony abnormality is seen. IMPRESSION: Chronic changes as described above. Scattered small less than 5 mm subpleural nodules. No follow-up needed if patient is low-risk (and has no known or suspected primary neoplasm). Non-contrast chest CT can be considered in 12 months if patient is high-risk. This recommendation follows the consensus statement: Guidelines for Management of Incidental Pulmonary Nodules Detected on CT Images:From the Fleischner Society 2017; published online before print (10.1148/radiol.IJ:2314499). Electronically Signed   By: Inez Catalina M.D.   On: 01/21/2016 19:41    Scheduled Meds: . atorvastatin  40 mg Oral QODAY  . calcium carbonate  1,250 mg Oral BID WC  . carvedilol  25 mg Oral BID WC  . levothyroxine  137 mcg Oral QAC breakfast  . losartan  100 mg Oral Daily  . oxybutynin  5 mg Oral TID  .  sodium chloride flush  3 mL Intravenous Q12H   Continuous Infusions: . sodium chloride 10 mL/hr at 01/22/16 [place], MD, PhD [facility] Pager [phone]  If 7PM-7AM, please contact night-coverage [email or web address] Password [facility] 01/23/2016, 11:21 AM

## 2016-01-23 NOTE — Care Management CC44 (Signed)
Condition Code 44 Documentation Completed  Patient Details  Name: [name] MRN: [id number] Date of Birth: [birth year]   Condition Code 44 given:  Yes Patient signature on Condition Code 44 notice:  Yes Documentation of 2 MD's agreement:  Yes Code 44 added to claim:  Yes    Purcell Mouton, RN 01/23/2016, 3:47 PM

## 2016-01-23 NOTE — Progress Notes (Signed)
ANTICOAGULATION CONSULT NOTE - Initial Consult  Pharmacy Consult for warfarin Indication: atrial fibrillation   Patient Measurements: Height: 5\' 5"  (165.1 cm) Weight: 225 lb 5 oz (102.2 kg) IBW/kg (Calculated) : 57   Vital Signs: Temp: 98.3 F (36.8 C) (07/26 0533) Temp Source: Oral (07/26 0533) BP: 120/65 (07/26 0533) Pulse Rate: 87 (07/26 0533)  Labs:  Recent Labs  01/21/16 1154 01/21/16 2314 01/22/16 0813 01/23/16 0452  HGB 6.5*  --  9.0* 9.4*  HCT 24.3*  --  29.3* 31.1*  PLT 208  --  210 219  LABPROT 34.8* 30.0*  --   --   INR 3.55* 2.92*  --   --   CREATININE 1.34*  --  1.27* 1.41*    Estimated Creatinine Clearance: 35.2 mL/min (by C-G formula based on SCr of 1.41 mg/dL).   Medical History: Past Medical History:  Diagnosis Date  . Atrial fibrillation ([place])   . CAD (coronary artery disease) 06/2010 cath   Occluded distal RCA, moderately diffuse LCx and LAD disease out of proportion to LV dysfxn, and significant MR  . CHF (congestive heart failure) (HCC)    EF 20-25% and severe MR   . CKD (chronic kidney disease)    Baseline Cr 1.1-1.2   . CVA (cerebral infarction)    Left hemiparesis  . Hyperlipidemia   . Hypertension   . Hypothyroidism   . 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 Venous insufficiency    Chronic LE edema     Assessment: 82 year old female with past medical history of atrial fibrillation, on Coumadin anticoagulation, chronic systolic CHF and hypothyroidism who came into the hospital complaining about left-sided chest pain. In the emergency department she was found to have hemoglobin of 6.5 and INR of 3.55.  Last dose of coumadin was 7/23 @ 1800.  Pt received 2 units pRBC's, per GI no active bleed.  Pharmacy consulted to dose warfarin.  Home dose warfarin 2mg  on Sun, Monday, Wednesday and Friday and 1mg  on Tues, Thurs, Sat  01/23/2016 INR 2.31 (therapeutic) Consuming 75-100 % meals H/H low but improved Scr 1.41, CrCl  ~ 75mls/min   Goal of Therapy:  INR 2-3 Monitor platelets by anticoagulation protocol   Plan:  Warfarin 2mg  po x1 at 1800 Daily INR  Dolly Rias RPh 01/23/2016, 1:01 PM Pager [phone]

## 2016-01-24 DIAGNOSIS — N183 Chronic kidney disease, stage 3 (moderate): Secondary | ICD-10-CM | POA: Diagnosis not present

## 2016-01-24 DIAGNOSIS — D649 Anemia, unspecified: Secondary | ICD-10-CM | POA: Diagnosis not present

## 2016-01-24 DIAGNOSIS — R0789 Other chest pain: Secondary | ICD-10-CM | POA: Diagnosis not present

## 2016-01-24 DIAGNOSIS — D509 Iron deficiency anemia, unspecified: Secondary | ICD-10-CM | POA: Diagnosis not present

## 2016-01-24 LAB — BASIC METABOLIC PANEL
ANION GAP: 7 (ref 5–15)
BUN: 20 mg/dL (ref 6–20)
CHLORIDE: 102 mmol/L (ref 101–111)
CO2: 29 mmol/L (ref 22–32)
Calcium: 10.6 mg/dL — ABNORMAL HIGH (ref 8.9–10.3)
Creatinine, Ser: 1.26 mg/dL — ABNORMAL HIGH (ref 0.44–1.00)
GFR calc Af Amer: 44 mL/min — ABNORMAL LOW (ref >60)
GFR calc non Af Amer: 38 mL/min — ABNORMAL LOW (ref >60)
GLUCOSE: 112 mg/dL — AB (ref 65–99)
POTASSIUM: 3.3 mmol/L — AB (ref 3.5–5.1)
SODIUM: 138 mmol/L (ref 135–145)

## 2016-01-24 LAB — PROTIME-INR
INR: 1.98
Prothrombin Time: 22.8 seconds — ABNORMAL HIGH (ref 11.4–15.2)

## 2016-01-24 LAB — CBC
HEMATOCRIT: 30.4 % — AB (ref 36.0–46.0)
HEMOGLOBIN: 9 g/dL — AB (ref 12.0–15.0)
MCH: 21.5 pg — ABNORMAL LOW (ref 26.0–34.0)
MCHC: 29.6 g/dL — ABNORMAL LOW (ref 30.0–36.0)
MCV: 72.7 fL — AB (ref 78.0–100.0)
Platelets: 209 10*3/uL (ref 150–400)
RBC: 4.18 MIL/uL (ref 3.87–5.11)
RDW: 21.1 % — AB (ref 11.5–15.5)
WBC: 6 10*3/uL (ref 4.0–10.5)

## 2016-01-24 MED ORDER — SENNOSIDES-DOCUSATE SODIUM 8.6-50 MG PO TABS: 1.0000 | ORAL_TABLET | Freq: Every evening | ORAL | 0 refills | Status: DC | PRN

## 2016-01-24 MED ORDER — WARFARIN SODIUM 2 MG PO TABS
2.0000 mg | ORAL_TABLET | Freq: Once | ORAL | Status: DC
  Filled 2016-01-24: qty 1

## 2016-01-24 MED ORDER — FERROUS GLUCONATE 324 (38 FE) MG PO TABS: 324.0000 mg | ORAL_TABLET | Freq: Every day | ORAL | 2 refills | Status: DC

## 2016-01-24 NOTE — Progress Notes (Signed)
Went over all discharge information with patient and family.  All questions answered.  Prescriptions given.  Pt wheeled out by NT.

## 2016-01-24 NOTE — Progress Notes (Signed)
Spoke with pt concerning HHPT. Pt states, that she will not need anyone extra coming to the home. Pt also states that her son is there with her.

## 2016-01-24 NOTE — Progress Notes (Signed)
ANTICOAGULATION CONSULT NOTE - Initial Consult  Pharmacy Consult for warfarin Indication: atrial fibrillation   Patient Measurements: Height: 5\' 5"  (165.1 cm) Weight: 223 lb 12.3 oz (101.5 kg) IBW/kg (Calculated) : 57   Vital Signs: Temp: 98 F (36.7 C) (07/27 0551) Temp Source: Oral (07/27 0551) BP: 135/79 (07/27 0551) Pulse Rate: 96 (07/27 0551)  Labs:  Recent Labs  01/21/16 2314 01/22/16 0813 01/23/16 0452 01/23/16 1313 01/24/16 0514  HGB  --  9.0* 9.4*  --  9.0*  HCT  --  29.3* 31.1*  --  30.4*  PLT  --  210 219  --  209  LABPROT 30.0*  --   --  25.7* 22.8*  INR 2.92*  --   --  2.31 1.98  CREATININE  --  1.27* 1.41*  --  1.26*    Estimated Creatinine Clearance: 39.2 mL/min (by C-G formula based on SCr of 1.26 mg/dL).   Medical History: Past Medical History:  Diagnosis Date  . Atrial fibrillation ([place])   . CAD (coronary artery disease) 06/2010 cath   Occluded distal RCA, moderately diffuse LCx and LAD disease out of proportion to LV dysfxn, and significant MR  . CHF (congestive heart failure) (HCC)    EF 20-25% and severe MR   . CKD (chronic kidney disease)    Baseline Cr 1.1-1.2   . CVA (cerebral infarction)    Left hemiparesis  . Hyperlipidemia   . Hypertension   . Hypothyroidism   . 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 Venous insufficiency    Chronic LE edema     Assessment: 82 year old female with past medical history of atrial fibrillation, on Coumadin anticoagulation, chronic systolic CHF and hypothyroidism who came into the hospital complaining about left-sided chest pain. In the emergency department she was found to have hemoglobin of 6.5 and INR of 3.55.  Last dose of coumadin was 7/23 @ 1800.  Pt received 2 units pRBC's, per GI no active bleed.  Pharmacy consulted to dose warfarin.  Home dose warfarin 2mg  on Sun, Monday, Wednesday and Friday and 1mg  on Tues, Thurs, Sat  01/24/2016 INR 1.98 slightly  sub-therapeutic Consuming 75-100 % meals H/H low but improved Scr 1.26, CrCl ~ 66mls/min   Goal of Therapy:  INR 2-3 Monitor platelets by anticoagulation protocol   Plan:  Warfarin 2mg  po x1 at 1800 Daily INR  Dolly Rias RPh 01/24/2016, 8:33 AM Pager [phone]

## 2016-01-24 NOTE — Discharge Summary (Addendum)
Physician Discharge Summary  [name] [id number] DOB: July 14, 1929 DOA: 01/21/2016  PCP: Vena Austria, MD  Admit date: 01/21/2016 Discharge date: 01/24/2016  Admitted From: home Disposition:  home  Recommendations for Outpatient Follow-up:  1. Follow up with PCP in 1-2 weeks 2. Please obtain INR/CBC in one week  Home Health: patient declined Equipment/Devices: none  Discharge Condition: stable CODE STATUS: Full Diet recommendation: regular  HPI: 82 year old female with past medical history of atrial fibrillation, on Coumadin anticoagulation, chronic systolic CHF and hypothyroidism who came into the hospital complaining about left-sided chest pain. In the emergency department she was found to have hemoglobin of 6.5 and INR of 3.55  Hospital Course: Discharge Diagnoses:  Active Problems:   Atrial fibrillation (HCC)   Chronic systolic CHF (congestive heart failure) (HCC)   Anemia   Left-sided chest wall pain   CKD (chronic kidney disease), stage III   Hypothyroidism  Anemia - Microcytic anemia, presented with hemoglobin of 6.5 and MCV of 71. Suspect acute on chronic, place on iron supplementation. Received 2 units of packed RBCs, hemoglobin improved to 9.0 and remained stable. She has no blood in her stools or urine, no melena. FOBT negative. Dr. Hartford Poli discussed with GI, Dr. Michail Sermon, no active bleed she can be worked up as an outpatient. She underwent a CT scan of abdomen and pelvis without acute findings Left-sided chest wall pain - Ribs x-ray showed no evidence of fractures, however CT did show non displaced 9th rib fracture, non traumatic and clinically unable to determine etiology. No falls/trauma per patient. Pain improved. Atrial fibrillation, chronic - Controlled rate, continue home medications. CHA2DS2-VAScat least 3, on Coumadin, presented with INR of 3.55. Without any evidence for an active bleed, will continue Coumadin Chronic diastolic CHF - This  is obtained from the records, patient appears to have no symptoms of CHF currently. CKD stage III - Creatinine is at baseline. Patient does not seem to be dehydrated. Hypothyroidism - Continue home dose of Synthroid, TSH 11. Will need to be repeated in 2-3 weeks as an outpatient   Discharge Instructions     Medication List    TAKE these medications   alendronate 70 MG tablet Commonly known as:  FOSAMAX Take 70 mg by mouth once a week. thursday   atorvastatin 80 MG tablet Commonly known as:  LIPITOR Take 40 mg by mouth every other day.   calcium carbonate 1500 (600 Ca) MG Tabs tablet Commonly known as:  OSCAL Take 1,500 mg by mouth 2 (two) times daily with a meal.   carvedilol 25 MG tablet Commonly known as:  COREG Take 25 mg by mouth 2 (two) times daily with a meal.   ferrous gluconate 324 MG tablet Commonly known as:  FERGON Take 1 tablet (324 mg total) by mouth daily with breakfast.   furosemide 40 MG tablet Commonly known as:  LASIX Take 40 mg by mouth daily.   levothyroxine 125 MCG tablet Commonly known as:  SYNTHROID, LEVOTHROID Take 125 mcg by mouth daily before breakfast.   losartan 100 MG tablet Commonly known as:  COZAAR Take 100 mg by mouth daily.   LUTEIN-ZEAXANTHIN PO Take 1 tablet by mouth 2 (two) times daily.   oxybutynin 5 MG tablet Commonly known as:  DITROPAN Take 5 mg by mouth 3 (three) times daily. Bladder spasms   potassium chloride SA 20 MEQ tablet Commonly known as:  K-DUR,KLOR-CON Take 20 mEq by mouth daily.   senna-docusate 8.6-50 MG tablet Commonly known as:  Senokot-S Take 1 tablet by mouth at bedtime as needed for mild constipation.   warfarin 2 MG tablet Commonly known as:  COUMADIN Take 2 mg by mouth as directed. Take 2mg  by mouth once daily on Sunday, Monday, Wednesdays and Friday   warfarin 1 MG tablet Commonly known as:  COUMADIN Take 1 mg by mouth as directed. Take 1mg  by mouth once daily on Tuesday, Thursday, and  Saturdays      Follow-up Information    READE,ROBERT [name], MD. Schedule an appointment as soon as possible for a visit in 2 week(s).   Specialty:  Family Medicine Contact information: [place] [address] [phone]          Allergies  Allergen Reactions  . Avelox [Moxifloxacin Hcl In Nacl] Other (See Comments)    "didn't know what was going on, lost 3 days"  . Doxycycline Hives, Itching and Rash    Consultations:    Procedures/Studies:  Ct Abdomen Pelvis Wo Contrast  Result Date: 01/23/2016 CLINICAL DATA:  Left-sided chest pain. Microcytic anemia. Low hemoglobin on presentation to the hospital. EXAM: CT ABDOMEN AND PELVIS WITHOUT CONTRAST TECHNIQUE: Multidetector CT imaging of the abdomen and pelvis was performed following the standard protocol without IV contrast. COMPARISON:  Chest CT, 01/21/2016 FINDINGS: Lung bases: Airspace opacity at the base of the right middle lobe. Interstitial thickening in both lung bases, also greatest in the right middle lobe. Right middle lobe opacity has increased from the recent prior CT. Heart is enlarged but stable. Hepatobiliary: Unremarkable liver. Gallbladder surgically absent. No bile duct dilation. Spleen:  Normal. Pancreas:  No mass or inflammatory. Adrenal glands:  No masses. Kidneys, ureters, bladder: Bilateral renal cortical thinning with more focal areas of renal scarring. No masses or stones. No hydronephrosis. Normal ureters. Normal bladder. Uterus and adnexa:  Unremarkable. Lymph nodes:  No adenopathy. Ascites:  None. Vascular: Diffuse atherosclerotic calcifications noted along the aorta its branch vessels. No aneurysm. Gastrointestinal: Moderate to large hiatal hernia. Stomach otherwise unremarkable. Small bowel and colon are unremarkable. Normal appendix visualized. Abdominal wall: Fat containing paraumbilical hernia with a base measuring 2.5 cm. No bowel enters this. Musculoskeletal: Nondisplaced  fracture of the left posterior lateral ninth rib. No other fractures. There are significant degenerative changes throughout the lumbar spine. No osteoblastic or osteolytic lesions. IMPRESSION: 1. Recent, nondisplaced, left posterior, lateral ninth rib fracture. 2. Airspace and interstitial opacities in the right middle lobe at the lung base have increased from the prior exam. This may reflect atelectasis. Infection should be considered in the proper clinical setting. 3. No other evidence of an acute abnormality. No findings to explain the patient's microcytic anemia. Electronically Signed   By: Lajean Manes M.D.   On: 01/23/2016 12:49  Dg Ribs Unilateral W/chest Left  Result Date: 01/21/2016 CLINICAL DATA:  82 year old female felt pop when lying down left rib region. Pain worse with motion. Initial encounter. EXAM: LEFT RIBS AND CHEST - 3+ VIEW COMPARISON:  10/17/2014 and 12/12/2011 chest x-ray. FINDINGS: No left-sided rib fracture or pneumothorax detected. Degenerative changes acromioclavicular joint and throughout the thoracic spine. Cardiomegaly. Pulmonary vascular prominence most notable centrally. Chronic interstitial markings similar to prior exam. Moderate-size hiatal hernia. Right-sided pleural thickening. Calcified ectatic aorta. Tracheal displacement by ectatic aorta. IMPRESSION: No left-sided rib fracture or pneumothorax noted. Cardiomegaly. Pulmonary vascular prominence most notable centrally. Chronic interstitial markings similar to prior exam. Moderate-size hiatal hernia. Aortic atherosclerosis. Electronically Signed   By: Alcide Evener.D.  On: 01/21/2016 11:21  Ct Chest Wo Contrast  Result Date: 01/21/2016 CLINICAL DATA:  Left-sided chest pain EXAM: CT CHEST WITHOUT CONTRAST TECHNIQUE: Multidetector CT imaging of the chest was performed following the standard protocol without IV contrast. COMPARISON:  None. FINDINGS: Cardiovascular: Aortic calcifications are identified without aneurysmal  dilatation. Heavy coronary calcifications are seen. Cardiac shadow is mildly enlarged. Mediastinum/Nodes: No significant lymphadenopathy is identified. Thoracic inlet is within normal limits. Lungs/Pleura: Lungs are well aerated bilaterally. Minimal interstitial thickening is noted likely of a chronic nature. No focal infiltrate or sizable effusion is seen. A few tiny subpleural nodules are noted. The largest of these is seen on image number 60 of series 5 measuring 4 mm. Upper Abdomen: Large hiatal hernia is noted. The visualized upper abdomen shows changes of prior cholecystectomy. Musculoskeletal: Degenerative changes of the thoracic spine are noted. No other bony abnormality is seen. IMPRESSION: Chronic changes as described above. Scattered small less than 5 mm subpleural nodules. No follow-up needed if patient is low-risk (and has no known or suspected primary neoplasm). Non-contrast chest CT can be considered in 12 months if patient is high-risk. This recommendation follows the consensus statement: Guidelines for Management of Incidental Pulmonary Nodules Detected on CT Images:From the Fleischner Society 2017; published online before print (10.1148/radiol.IJ:2314499). Electronically Signed   By: Inez Catalina M.D.   On: 01/21/2016 19:41     Subjective: - no chest pain, shortness of breath, no abdominal pain, nausea or vomiting. Asking to go home  Discharge Exam: Vitals:   01/23/16 2122 01/24/16 0551  BP: 104/78 135/79  Pulse: 99 96  Resp: 20 20  Temp: 98.7 F (37.1 C) 98 F (36.7 C)   Vitals:   01/23/16 0533 01/23/16 1354 01/23/16 2122 01/24/16 0551  BP: 120/65 124/83 104/78 135/79  Pulse: 87 64 99 96  Resp: 20 20 20 20   Temp: 98.3 F (36.8 C) 97.9 F (36.6 C) 98.7 F (37.1 C) 98 F (36.7 C)  TempSrc: Oral Oral Oral Oral  SpO2: 98% 98% 96% 95%  Weight:    101.5 kg (223 lb 12.3 oz)  Height:        General: Pt is alert, awake, not in acute distress Cardiovascular: RRR, S1/S2 +,  no rubs, no gallops Respiratory: CTA bilaterally, no wheezing, no rhonchi Abdominal: Soft, NT, ND, bowel sounds + Extremities: no edema, no cyanosis    The results of significant diagnostics from this hospitalization (including imaging, microbiology, ancillary and laboratory) are listed below for reference.     Microbiology: No results found for this or any previous visit (from the past 240 hour(s)).   Labs: BNP (last 3 results)  Recent Labs  01/21/16 1154  BNP [id number]*   Basic Metabolic Panel:  Recent Labs Lab 01/21/16 1154 01/22/16 0813 01/23/16 0452 01/24/16 0514  NA 139 140 140 138  K 4.0 3.8 3.5 3.3*  CL 108 105 104 102  CO2 25 28 29 29   GLUCOSE 108* 104* 126* 112*  BUN 19 20 21* 20  CREATININE 1.34* 1.27* 1.41* 1.26*  CALCIUM 9.5 10.3 10.9* 10.6*   Liver Function Tests: No results for input(s): AST, ALT, ALKPHOS, BILITOT, PROT, ALBUMIN in the last 168 hours. No results for input(s): LIPASE, AMYLASE in the last 168 hours. No results for input(s): AMMONIA in the last 168 hours. CBC:  Recent Labs Lab 01/21/16 1154 01/22/16 0813 01/23/16 0452 01/24/16 0514  WBC 5.7 5.6 6.4 6.0  NEUTROABS 3.9  --   --   --  HGB 6.5* 9.0* 9.4* 9.0*  HCT 24.3* 29.3* 31.1* 30.4*  MCV 71.7* 72.3* 72.7* 72.7*  PLT 208 210 219 209   Cardiac Enzymes: No results for input(s): CKTOTAL, CKMB, CKMBINDEX, TROPONINI in the last 168 hours. BNP: Invalid input(s): POCBNP CBG: No results for input(s): GLUCAP in the last 168 hours. D-Dimer No results for input(s): DDIMER in the last 72 hours. Hgb A1c  Recent Labs  01/21/16 2314  HGBA1C 5.7*   Lipid Profile No results for input(s): CHOL, HDL, LDLCALC, TRIG, CHOLHDL, LDLDIRECT in the last 72 hours. Thyroid function studies  Recent Labs  01/21/16 2314  TSH 11.783*   Anemia work up No results for input(s): VITAMINB12, FOLATE, FERRITIN, TIBC, IRON, RETICCTPCT in the last 72 hours. Urinalysis    Component Value Date/Time     COLORURINE YELLOW 12/12/2011 2221   APPEARANCEUR CLOUDY (A) 12/12/2011 2221   LABSPEC 1.018 12/12/2011 2221   PHURINE 5.5 12/12/2011 2221   GLUCOSEU NEGATIVE 12/12/2011 2221   HGBUR LARGE (A) 12/12/2011 2221   BILIRUBINUR NEGATIVE 12/12/2011 2221   KETONESUR NEGATIVE 12/12/2011 2221   PROTEINUR NEGATIVE 12/12/2011 2221   UROBILINOGEN 0.2 12/12/2011 2221   NITRITE POSITIVE (A) 12/12/2011 2221   LEUKOCYTESUR LARGE (A) 12/12/2011 2221   Sepsis Labs Invalid input(s): PROCALCITONIN,  WBC,  LACTICIDVEN Microbiology No results found for this or any previous visit (from the past 240 hour(s)).   Time coordinating discharge: Over 30 minutes  SIGNED:  Marzetta Board, MD  [facility] 01/24/2016, 2:51 PM Pager [phone]  If 7PM-7AM, please contact night-coverage [email or web address] Password [email or web address]

## 2016-01-24 NOTE — Evaluation (Signed)
Physical Therapy Evaluation Patient Details Name: CADEE PALSER MRN: [id number] DOB: October 23, 1929 Today's Date: 01/24/2016   History of Present Illness  82 year old female with past medical history of atrial fibrillation, on Coumadin anticoagulation, chronic systolic CHF and hypothyroidism who came into the hospital complaining about left-sided chest pain.  CT shows recent, nondisplaced, left posterior, lateral ninth rib fracture  Clinical Impression  Pt admitted with above diagnosis. Pt currently with functional limitations due to the deficits listed below (see PT Problem List).  Pt will benefit from skilled PT to increase their independence and safety with mobility to allow discharge to the venue listed below.  Pt reports she has not been ambulating since day of admission.  Pt overall min/guard for mobility and reports her son will be home 24/7 for assist if needed.       Follow Up Recommendations Home health PT;Supervision/Assistance - 24 hour    Equipment Recommendations  None recommended by PT    Recommendations for Other Services       Precautions / Restrictions Precautions Precautions: Fall Precaution Comments: L hemiparesis, does not like feet touched      Mobility  Bed Mobility Overal bed mobility: Needs Assistance Bed Mobility: Supine to Sit     Supine to sit: HOB elevated;Supervision     General bed mobility comments: increased time and effort due to L rib pain  Transfers Overall transfer level: Needs assistance Equipment used: Rolling walker (2 wheeled) Transfers: Sit to/from Stand Sit to Stand: Min guard         General transfer comment: increased time however no physical assist required  Ambulation/Gait Ambulation/Gait assistance: Min guard Ambulation Distance (Feet): 35 Feet Assistive device: Rolling walker (2 wheeled) Gait Pattern/deviations: Step-to pattern;Decreased dorsiflexion - left;Decreased weight shift to left     General Gait Details: pt  leads with R LE, hx of L hemiparesis, inverts foot as DF compensation, slow but steady gait, fatigues quickly  Stairs            Wheelchair Mobility    Modified Rankin (Stroke Patients Only)       Balance Overall balance assessment:  (denies any recent falls)                                           Pertinent Vitals/Pain Pain Assessment: 0-10 Pain Score: 7  Pain Location: left flank Pain Descriptors / Indicators: Grimacing;Discomfort Pain Intervention(s): Monitored during session;Repositioned;Patient requesting pain meds-RN notified  HR: presession 93 bpm, postsession 92 bpm    Home Living Family/patient expects to be discharged to:: Private residence Living Arrangements: Children Available Help at Discharge: Family;Available 24 hours/day Type of Home: House Home Access: Stairs to enter   [employer] of Steps: 2 Home Layout: One level Home Equipment: Walker - 2 wheels;Wheelchair - manual      Prior Function Level of Independence: Independent with assistive device(s)               Hand Dominance        Extremity/Trunk Assessment               Lower Extremity Assessment: LLE deficits/detail;Generalized weakness   LLE Deficits / Details: hx L hemiplegia, unable to perform DF     Communication   Communication: No difficulties  Cognition Arousal/Alertness: Awake/alert Behavior During Therapy: WFL for tasks assessed/performed Overall Cognitive Status: Within Functional Limits for tasks assessed  General Comments      Exercises        Assessment/Plan    PT Assessment Patient needs continued PT services  PT Diagnosis Difficulty walking;Generalized weakness   PT Problem List Decreased strength;Decreased activity tolerance;Decreased mobility;Pain  PT Treatment Interventions DME instruction;Gait training;Functional mobility training;Therapeutic activities;Patient/family  education;Therapeutic exercise;Stair training   PT Goals (Current goals can be found in the Care Plan section) Acute Rehab PT Goals PT Goal Formulation: With patient Time For Goal Achievement: 01/31/16 Potential to Achieve Goals: Good    Frequency Min 3X/week   Barriers to discharge        Co-evaluation               End of Session Equipment Utilized During Treatment: Gait belt Activity Tolerance: Patient limited by fatigue Patient left: in chair;with call bell/phone within reach;with chair alarm set Nurse Communication: Mobility status    Functional Assessment Tool Used: clinical judgement Functional Limitation: Mobility: Walking and moving around Mobility: Walking and Moving Around Current Status [phone]): At least 1 percent but less than 20 percent impaired, limited or restricted Mobility: Walking and Moving Around Goal Status [phone]): At least 1 percent but less than 20 percent impaired, limited or restricted    Time: 0904-0925 PT Time Calculation (min) (ACUTE ONLY): 21 min   Charges:   PT Evaluation $PT Eval Moderate Complexity: 1 Procedure     PT G Codes:   PT G-Codes **NOT FOR INPATIENT CLASS** Functional Assessment Tool Used: clinical judgement Functional Limitation: Mobility: Walking and moving around Mobility: Walking and Moving Around Current Status [phone]): At least 1 percent but less than 20 percent impaired, limited or restricted Mobility: Walking and Moving Around Goal Status [phone]): At least 1 percent but less than 20 percent impaired, limited or restricted    Arriyah Madej,[name] 01/24/2016, 12:27 PM Carmelia Bake, PT, DPT 01/24/2016 Pager: [phone]

## 2018-09-30 ENCOUNTER — Encounter (HOSPITAL_COMMUNITY): Payer: Self-pay | Admitting: Emergency Medicine

## 2018-09-30 ENCOUNTER — Emergency Department (HOSPITAL_COMMUNITY): Payer: Medicare Other

## 2018-09-30 ENCOUNTER — Observation Stay (HOSPITAL_COMMUNITY)
Admission: EM | Admit: 2018-09-30 | Discharge: 2018-10-01 | Disposition: A | Discharge status: Home / Self Care | Payer: Medicare Other | Attending: Family Medicine | Admitting: Family Medicine

## 2018-09-30 ENCOUNTER — Other Ambulatory Visit: Payer: Self-pay

## 2018-09-30 DIAGNOSIS — M6281 Muscle weakness (generalized): Secondary | ICD-10-CM | POA: Insufficient documentation

## 2018-09-30 DIAGNOSIS — I6930 Unspecified sequelae of cerebral infarction: Secondary | ICD-10-CM

## 2018-09-30 DIAGNOSIS — G9340 Encephalopathy, unspecified: Principal | ICD-10-CM

## 2018-09-30 DIAGNOSIS — E039 Hypothyroidism, unspecified: Secondary | ICD-10-CM | POA: Diagnosis present

## 2018-09-30 DIAGNOSIS — Z87891 Personal history of nicotine dependence: Secondary | ICD-10-CM | POA: Insufficient documentation

## 2018-09-30 DIAGNOSIS — N183 Chronic kidney disease, stage 3 (moderate): Secondary | ICD-10-CM | POA: Insufficient documentation

## 2018-09-30 DIAGNOSIS — R471 Dysarthria and anarthria: Secondary | ICD-10-CM | POA: Diagnosis not present

## 2018-09-30 DIAGNOSIS — R7989 Other specified abnormal findings of blood chemistry: Secondary | ICD-10-CM | POA: Diagnosis not present

## 2018-09-30 DIAGNOSIS — Z8673 Personal history of transient ischemic attack (TIA), and cerebral infarction without residual deficits: Secondary | ICD-10-CM | POA: Diagnosis not present

## 2018-09-30 DIAGNOSIS — I2510 Atherosclerotic heart disease of native coronary artery without angina pectoris: Secondary | ICD-10-CM | POA: Insufficient documentation

## 2018-09-30 DIAGNOSIS — R791 Abnormal coagulation profile: Secondary | ICD-10-CM | POA: Diagnosis not present

## 2018-09-30 DIAGNOSIS — I130 Hypertensive heart and chronic kidney disease with heart failure and stage 1 through stage 4 chronic kidney disease, or unspecified chronic kidney disease: Secondary | ICD-10-CM | POA: Diagnosis not present

## 2018-09-30 DIAGNOSIS — R778 Other specified abnormalities of plasma proteins: Secondary | ICD-10-CM

## 2018-09-30 DIAGNOSIS — I4891 Unspecified atrial fibrillation: Secondary | ICD-10-CM | POA: Insufficient documentation

## 2018-09-30 DIAGNOSIS — I5022 Chronic systolic (congestive) heart failure: Secondary | ICD-10-CM | POA: Diagnosis not present

## 2018-09-30 DIAGNOSIS — N189 Chronic kidney disease, unspecified: Secondary | ICD-10-CM

## 2018-09-30 DIAGNOSIS — Z7901 Long term (current) use of anticoagulants: Secondary | ICD-10-CM | POA: Insufficient documentation

## 2018-09-30 DIAGNOSIS — Z79899 Other long term (current) drug therapy: Secondary | ICD-10-CM | POA: Diagnosis not present

## 2018-09-30 DIAGNOSIS — N179 Acute kidney failure, unspecified: Secondary | ICD-10-CM

## 2018-09-30 DIAGNOSIS — R55 Syncope and collapse: Secondary | ICD-10-CM | POA: Diagnosis present

## 2018-09-30 LAB — APTT: aPTT: 59 seconds — ABNORMAL HIGH (ref 24–36)

## 2018-09-30 LAB — COMPREHENSIVE METABOLIC PANEL
ALT: 12 U/L (ref 0–44)
AST: 15 U/L (ref 15–41)
Albumin: 3.3 g/dL — ABNORMAL LOW (ref 3.5–5.0)
Alkaline Phosphatase: 102 U/L (ref 38–126)
Anion gap: 9 (ref 5–15)
BUN: 25 mg/dL — ABNORMAL HIGH (ref 8–23)
CO2: 24 mmol/L (ref 22–32)
Calcium: 9.5 mg/dL (ref 8.9–10.3)
Chloride: 104 mmol/L (ref 98–111)
Creatinine, Ser: 2.4 mg/dL — ABNORMAL HIGH (ref 0.44–1.00)
GFR calc Af Amer: 20 mL/min — ABNORMAL LOW (ref >60)
GFR calc non Af Amer: 18 mL/min — ABNORMAL LOW (ref >60)
Glucose, Bld: 100 mg/dL — ABNORMAL HIGH (ref 70–99)
Potassium: 4.3 mmol/L (ref 3.5–5.1)
Sodium: 137 mmol/L (ref 135–145)
Total Bilirubin: 1 mg/dL (ref 0.3–1.2)
Total Protein: 6.9 g/dL (ref 6.5–8.1)

## 2018-09-30 LAB — CBC
HCT: 39.3 % (ref 36.0–46.0)
Hemoglobin: 12.5 g/dL (ref 12.0–15.0)
MCH: 29.4 pg (ref 26.0–34.0)
MCHC: 31.8 g/dL (ref 30.0–36.0)
MCV: 92.5 fL (ref 80.0–100.0)
Platelets: 169 10*3/uL (ref 150–400)
RBC: 4.25 MIL/uL (ref 3.87–5.11)
RDW: 14.6 % (ref 11.5–15.5)
WBC: 7.6 10*3/uL (ref 4.0–10.5)
nRBC: 0 % (ref 0.0–0.2)

## 2018-09-30 LAB — PROTIME-INR
INR: 4.8 (ref 0.8–1.2)
Prothrombin Time: 44.3 seconds — ABNORMAL HIGH (ref 11.4–15.2)

## 2018-09-30 LAB — DIFFERENTIAL
Abs Immature Granulocytes: 0.04 10*3/uL (ref 0.00–0.07)
Basophils Absolute: 0 10*3/uL (ref 0.0–0.1)
Basophils Relative: 0 %
Eosinophils Absolute: 0.2 10*3/uL (ref 0.0–0.5)
Eosinophils Relative: 2 %
Immature Granulocytes: 1 %
Lymphocytes Relative: 20 %
Lymphs Abs: 1.6 10*3/uL (ref 0.7–4.0)
Monocytes Absolute: 0.7 10*3/uL (ref 0.1–1.0)
Monocytes Relative: 9 %
Neutro Abs: 5.2 10*3/uL (ref 1.7–7.7)
Neutrophils Relative %: 68 %

## 2018-09-30 LAB — TROPONIN I: Troponin I: 0.08 ng/mL (ref <0.03)

## 2018-09-30 LAB — ETHANOL: Alcohol, Ethyl (B): <10 mg/dL (ref <10)

## 2018-09-30 LAB — I-STAT CREATININE, ED: Creatinine, Ser: 2.5 mg/dL — ABNORMAL HIGH (ref 0.44–1.00)

## 2018-09-30 MED ORDER — LORAZEPAM 2 MG/ML IJ SOLN: 1.0000 mg | Freq: Once | INTRAMUSCULAR | Status: DC

## 2018-09-30 NOTE — Consult Note (Signed)
Neurology Consultation Reason for Consult: Dysarthria Referring Physician: Darl Householder, D  CC: "I does not feel like myself"  History is obtained from: Patient  HPI: [name] is a 85 y.o. female with a history of atrial fibrillation on Xarelto who presents with dysarthria.  She states that she does not feel like her speech is any different than it is typically, but her son thought that it was and therefore she was transported to the emergency department.  She has a history of CVA with chronic left hemiparesis.  She denies any other symptoms currently, no cough, no chest pain, no shortness of breath, no abdominal pain, no nausea or vomiting.  Is reported that she was lightheaded and like she was about to pass out.   LKW: Unclear tpa given?: no, mild symptoms    ROS: A 14 point ROS was performed and is negative except as noted in the HPI.  Past Medical History:  Diagnosis Date  . Atrial fibrillation ([place])   . CAD (coronary artery disease) 06/2010 cath   Occluded distal RCA, moderately diffuse LCx and LAD disease out of proportion to LV dysfxn, and significant MR  . CHF (congestive heart failure) (HCC)    EF 20-25% and severe MR   . CKD (chronic kidney disease)    Baseline Cr 1.1-1.2   . CVA (cerebral infarction)    Left hemiparesis  . Hyperlipidemia   . Hypertension   . Hypothyroidism   . 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 Venous insufficiency    Chronic LE edema     History reviewed. No pertinent family history.   Social History:  reports that she has quit smoking. She has never used smokeless tobacco. She reports that she does not drink alcohol or use drugs.   Exam: Current vital signs: Ht 5\' 4"  (1.626 m)   Wt 95 kg   BMI 35.95 kg/m  Vital signs in last 24 hours: Weight:  [95 kg] 95 kg (04/02 2030)   Physical Exam  Constitutional: Appears well-developed and well-nourished.  Psych: Affect appropriate to situation Eyes: No scleral  injection HENT: No OP obstrucion Head: Normocephalic.  Cardiovascular: Normal rate and regular rhythm.  Respiratory: Effort normal, non-labored breathing GI: Soft.  No distension. There is no tenderness.  Skin: WDI  Neuro: Mental Status: Patient is awake, alert, oriented to person, place, month, year, and situation. Patient is able to give a clear and coherent history. No signs of aphasia or neglect Cranial Nerves: II: Visual Fields are full. Pupils are equal, round, and reactive to light.   III,IV, VI: EOMI without ptosis or diploplia.  V: Facial sensation is symmetric to temperature VII: Facial movement with left facial weakness, she has frequent mouth movements consistent with being edentulous VIII: hearing is intact to voice X: Uvula elevates symmetrically XI: Shoulder shrug is symmetric. XII: tongue is midline without atrophy or fasciculations.  Motor: Chronic state left hemiparesis, though she is able to lift her left arm against gravity.  She is able to hold both the right arm and leg against drift Sensory: Sensation is symmetric to light touch and temperature in the arms and legs. Cerebellar: No clear ataxia   I have reviewed labs in epic and the results pertinent to this consultation are: 90.5  I have reviewed the images obtained: CT head unremarkable  Impression: 85 year old female who had what sounds like presyncope.  Given the concern for new dysarthria, an MRI would not be unreasonable.  If this is negative, then I would not pursue any further work-up from a neurological perspective.  Recommendations: 1) MRI brain 2) please call if this returns positive.   Roland Rack, MD [facility] [phone]  If 7pm- 7am, please page neurology on call as listed in [place].

## 2018-09-30 NOTE — ED Provider Notes (Signed)
[facility] Provider Note   CSN: [id number] Arrival date & time: 09/30/18  2013    History   Chief Complaint Chief Complaint  Patient presents with   Weakness    HPI [name] is a 85 y.o. female history of CAD, A. fib on Coumadin, here presenting with altered mental status, trouble speaking.  Patient felt confused around noon time.  Patient then slept in the afternoon.  Daughter came home around 5 PM and patient seemed normal.  Son came home and saw her around 8 PM.  Patient was noted to be very confused and disoriented.  She stood up and had a episode of near syncope.  Patient also was noted to have some trouble speaking at that time as well.  Patient is confused and unable to give much history.  Talk to the daughter on the phone who gave me much of the history. Patient had previous stroke with L sided weakness. Patient has hx of CAD as well but has no chest pain.      The history is provided by the patient. The history is limited by the condition of the patient.  Level V caveat- confusion   Past Medical History:  Diagnosis Date   Atrial fibrillation (HCC)    CAD (coronary artery disease) 06/2010 cath   Occluded distal RCA, moderately diffuse LCx and LAD disease out of proportion to LV dysfxn, and significant MR   CHF (congestive heart failure) (HCC)    EF 20-25% and severe MR    CKD (chronic kidney disease)    Baseline Cr 1.1-1.2    CVA (cerebral infarction)    Left hemiparesis   Hyperlipidemia    Hypertension    Hypothyroidism    Torsades de pointes (HCC)    Moxifloxacin induced    Venous insufficiency    Chronic LE edema    Patient Active Problem List   Diagnosis Date Noted   CAD (coronary artery disease) 10/01/2018   Near syncope 09/30/2018   Acute-on-chronic kidney injury ([place]) 09/30/2018   Elevated troponin 09/30/2018   Supratherapeutic INR 09/30/2018   History of cerebrovascular accident (CVA) with  residual deficit 09/30/2018   Anemia 01/21/2016   Left-sided chest wall pain 01/21/2016   CKD (chronic kidney disease), stage III ([place]) 01/21/2016   Hypothyroidism 01/21/2016   Atrial fibrillation (HCC)    Chronic systolic CHF (congestive heart failure) ([place])     Past Surgical History:  Procedure Laterality Date   CHOLECYSTECTOMY       OB History   No obstetric history on file.      Home Medications    Prior to Admission medications   Medication Sig Start Date End Date Taking? Authorizing Provider  alendronate (FOSAMAX) 70 MG tablet Take 70 mg by mouth once a week. thursday 01/07/16  Yes [provider]  atorvastatin (LIPITOR) 40 MG tablet Take 40 mg by mouth every other day.    Yes [provider]  calcium carbonate (OSCAL) 1500 (600 Ca) MG TABS tablet Take 1,500 mg by mouth 2 (two) times daily with a meal.   Yes [provider]  ferrous gluconate (FERGON) 324 MG tablet Take 1 tablet (324 mg total) by mouth daily with breakfast. 01/24/16  Yes Gherghe, Vella Redhead, MD  furosemide (LASIX) 40 MG tablet Take 40 mg by mouth daily.   Yes [provider]  levothyroxine (SYNTHROID, LEVOTHROID) 125 MCG tablet Take 125 mcg by mouth daily before breakfast.   Yes [provider]  potassium chloride (K-DUR,KLOR-CON) 10 MEQ tablet Take 10 mEq by mouth daily.    Yes [provider]  solifenacin (VESICARE) 10 MG tablet Take 10 mg by mouth daily. 07/23/18  Yes [provider]  XARELTO 20 MG TABS tablet Take 20 mg by mouth every evening. 07/30/18  Yes [provider]  acetaminophen (TYLENOL) 325 MG tablet Take 2 tablets (650 mg total) by mouth every 6 (six) hours as needed for mild pain, moderate pain or headache (or Fever >/= 101). 10/01/18   Roxan Hockey, MD  carvedilol (COREG) 6.25 MG tablet Take 1 tablet (6.25 mg total) by mouth 2 (two) times daily with a meal. 10/01/18   Emokpae, Courage, MD  losartan (COZAAR) 25 MG tablet  Take 1 tablet (25 mg total) by mouth daily. 10/01/18   Emokpae, Courage, MD  senna-docusate (SENOKOT-S) 8.6-50 MG tablet Take 2 tablets by mouth at bedtime. 10/01/18   Roxan Hockey, MD    Family History Family History  Problem Relation Age of Onset   Cancer Brother     Social History Social History   Tobacco Use   Smoking status: Former Smoker   Smokeless tobacco: Never Used   Tobacco comment: quit smoking 20-30 years ago  Substance Use Topics   Alcohol use: No   Drug use: No     Allergies   Avelox [moxifloxacin hcl in nacl] and Doxycycline   Review of Systems Review of Systems  Neurological: Positive for weakness.  Psychiatric/Behavioral: Positive for confusion.  All other systems reviewed and are negative.    Physical Exam Updated Vital Signs BP 125/78    Pulse 70    Temp 97.8 F (36.6 C) (Oral)    Resp (!) 24    Ht 5\' 4"  (1.626 m)    Wt 95 kg    SpO2 98%    BMI 35.95 kg/m   Physical Exam Vitals signs and nursing note reviewed.  HENT:     Head: Normocephalic.     Nose: Nose normal.     Mouth/Throat:     Mouth: Mucous membranes are moist.  Eyes:     Extraocular Movements: Extraocular movements intact.     Pupils: Pupils are equal, round, and reactive to light.  Neck:     Musculoskeletal: Normal range of motion.  Cardiovascular:     Rate and Rhythm: Normal rate and regular rhythm.     Pulses: Normal pulses.  Pulmonary:     Effort: Pulmonary effort is normal.     Breath sounds: Normal breath sounds.  Abdominal:     General: Abdomen is flat.     Palpations: Abdomen is soft.  Musculoskeletal: Normal range of motion.  Skin:    General: Skin is warm.     Capillary Refill: Capillary refill takes less than 2 seconds.  Neurological:     Mental Status: She is alert.     Comments: A & O x 3. Strength 4/5 L arm and leg (chronic). Some expressive aphasia but difficult to assess since she has no teeth. No obvious facial droop   Psychiatric:        Mood  and Affect: Mood normal.      ED Treatments / Results  Labs (all labs ordered are listed, but only abnormal results are displayed) Labs Reviewed  URINE CULTURE - Abnormal; Notable for the following components:      Result Value   Culture   (*)    Value: >=100,000 COLONIES/mL MULTIPLE SPECIES PRESENT,  SUGGEST RECOLLECTION   All other components within normal limits  PROTIME-INR - Abnormal; Notable for the following components:   Prothrombin Time 44.3 (*)    INR 4.8 (*)    All other components within normal limits  APTT - Abnormal; Notable for the following components:   aPTT 59 (*)    All other components within normal limits  COMPREHENSIVE METABOLIC PANEL - Abnormal; Notable for the following components:   Glucose, Bld 100 (*)    BUN 25 (*)    Creatinine, Ser 2.40 (*)    Albumin 3.3 (*)    GFR calc non Af Amer 18 (*)    GFR calc Af Amer 20 (*)    All other components within normal limits  URINALYSIS, ROUTINE W REFLEX MICROSCOPIC - Abnormal; Notable for the following components:   Color, Urine AMBER (*)    APPearance CLOUDY (*)    Hgb urine dipstick SMALL (*)    Protein, ur 30 (*)    Leukocytes,Ua LARGE (*)    WBC, UA >50 (*)    Bacteria, UA RARE (*)    All other components within normal limits  TROPONIN I - Abnormal; Notable for the following components:   Troponin I 0.08 (*)    All other components within normal limits  CBC - Abnormal; Notable for the following components:   Platelets 118 (*)    All other components within normal limits  BASIC METABOLIC PANEL - Abnormal; Notable for the following components:   CO2 20 (*)    Glucose, Bld 108 (*)    BUN 25 (*)    Creatinine, Ser 2.27 (*)    GFR calc non Af Amer 19 (*)    GFR calc Af Amer 22 (*)    All other components within normal limits  TROPONIN I - Abnormal; Notable for the following components:   Troponin I 0.07 (*)    All other components within normal limits  TROPONIN I - Abnormal; Notable for the  following components:   Troponin I 0.08 (*)    All other components within normal limits  GLUCOSE, CAPILLARY - Abnormal; Notable for the following components:   Glucose-Capillary 112 (*)    All other components within normal limits  GLUCOSE, CAPILLARY - Abnormal; Notable for the following components:   Glucose-Capillary 106 (*)    All other components within normal limits  I-STAT CREATININE, ED - Abnormal; Notable for the following components:   Creatinine, Ser 2.50 (*)    All other components within normal limits  ETHANOL  CBC  DIFFERENTIAL  RAPID URINE DRUG SCREEN, HOSP PERFORMED  TSH    EKG EKG Interpretation  Date/Time:  Thursday September 30 2018 20:31:59 EDT Ventricular Rate:  91 PR Interval:    QRS Duration: 96 QT Interval:  406 QTC Calculation: 500 R Axis:   10 Text Interpretation:  Atrial fibrillation Nonspecific T abnormalities, lateral leads Borderline prolonged QT interval No significant change since last tracing Confirmed by Wandra Arthurs [phone]) on 09/30/2018 8:40:27 PM   Radiology Ct Head Wo Contrast  Result Date: 09/30/2018 CLINICAL DATA:  Altered level of consciousness. EXAM: CT HEAD WITHOUT CONTRAST TECHNIQUE: Contiguous axial images were obtained from the base of the skull through the vertex without intravenous contrast. COMPARISON:  None. FINDINGS: Brain: Mild chronic ischemic white matter disease is noted. Mild diffuse cortical atrophy is noted. No mass effect or midline shift is noted. Ventricular size is within normal limits. There is no evidence of mass lesion, hemorrhage or  acute infarction. Vascular: No hyperdense vessel or unexpected calcification. Skull: Normal. Negative for fracture or focal lesion. Sinuses/Orbits: No acute finding. Other: None. IMPRESSION: Mild chronic ischemic white matter disease. Mild diffuse cortical atrophy. No acute intracranial abnormality seen. Electronically Signed   By: Marijo Conception, M.D.   On: 09/30/2018 21:18   Mr Brain Wo  Contrast  Result Date: 09/30/2018 CLINICAL DATA:  Dysarthria. History of stroke and LEFT hemiparesis, atrial fibrillation, hypertension and hyperlipidemia. EXAM: MRI HEAD WITHOUT CONTRAST TECHNIQUE: Multiplanar, multiecho pulse sequences of the brain and surrounding structures were obtained without intravenous contrast. COMPARISON:  CT HEAD September 30, 2018 FINDINGS: Moderately motion degraded examination. INTRACRANIAL CONTENTS: No reduced diffusion to suggest acute ischemia. No susceptibility artifact to suggest hemorrhage. Basal ganglia mineralization. No parenchymal brain volume loss for age. No hydrocephalus. Old small RIGHT basal ganglia infarct with mild ex vacuo dilatation subjacent ventricle. Patchy supratentorial white matter FLAIR T2 hyperintensities. No suspicious parenchymal signal, masses, mass effect. No abnormal extra-axial fluid collections. No extra-axial masses. VASCULAR: Normal major intracranial vascular flow voids present at skull base. SKULL AND UPPER CERVICAL SPINE: No abnormal sellar expansion. No suspicious calvarial bone marrow signal. Craniocervical junction maintained. SINUSES/ORBITS: LEFT maxillary mucosal retention cyst. Mastoid air cells are well aerated.The included ocular globes and orbital contents are non-suspicious. Status post bilateral ocular lens implants. OTHER: None. IMPRESSION: 1. No acute intracranial process on this moderately motion degraded examination. 2. Moderate chronic small vessel ischemic changes and old RIGHT basal ganglia infarct. Electronically Signed   By: Elon Alas M.D.   On: 09/30/2018 22:54   Dg Chest Port 1 View  Result Date: 09/30/2018 CLINICAL DATA:  Initial evaluation for acute altered mental status, weakness. EXAM: PORTABLE CHEST 1 VIEW COMPARISON:  Prior radiograph from 01/21/2016. FINDINGS: Advanced cardiomegaly, stable. Mediastinal silhouette within normal limits. Aortic atherosclerosis. Lungs are hypoinflated with secondary bibasilar  bronchovascular crowding. Scattered diffuse peribronchial thickening, likely reflecting mild interstitial congestion. No consolidative opacity. No pleural effusion. No pneumothorax. No acute osseous finding. Degenerative changes noted about the right shoulder. IMPRESSION: 1. Advanced cardiomegaly with mild diffuse interstitial congestion without overt pulmonary edema. 2. Aortic atherosclerosis. Electronically Signed   By: Jeannine Boga M.D.   On: 09/30/2018 21:57    Procedures Procedures (including critical care time)  Medications Ordered in ED Medications - No data to display   Initial Impression / Assessment and Plan / ED Course  I have reviewed the triage vital signs and the nursing notes.  Pertinent labs & imaging results that were available during my care of the patient were reviewed by me and considered in my medical decision making (see chart for details).       MILANNI AYUB is a 85 y.o. female here with AMS, near syncope, ? Slurred speech. She is within TPA window initially. I talked to Dr. Leonel Ramsay, who saw patient. Patient has chronic L sided deficits. Since patient is on coumadin and has no new deficits, he wants to hold off on code stroke for now. He recommend MRI brain and if negative, will not need further neuro workup. Also will do syncope workup. She has hx of CAD but has no chest pain.   10 pm  Patient has elevated INR 4.8. CT head unremarkable. Has AKi with Cr 2.5. Trop elevated 0.08. No obvious EKG changes. MRI brain showed no stroke. Will admit for encephalopathy from AKI, elevated trop, syncope workup.    Final Clinical Impressions(s) / ED Diagnoses   Final diagnoses:  Elevated  troponin  Encephalopathy    ED Discharge Orders         Ordered    senna-docusate (SENOKOT-S) 8.6-50 MG tablet  Daily at bedtime     10/01/18 1259    carvedilol (COREG) 6.25 MG tablet  2 times daily with meals     10/01/18 1259    losartan (COZAAR) 25 MG tablet  Daily      10/01/18 1259    acetaminophen (TYLENOL) 325 MG tablet  Every 6 hours PRN     10/01/18 1259    Increase activity slowly     10/01/18 1259    Diet - low sodium heart healthy     10/01/18 1259    Discharge instructions  Status:  Canceled    Comments:  1)Very low-salt diet advised 2)Weigh yourself daily, call if you gain more than 3 pounds in 1 day or more than 5 pounds in 1 week as your diuretic medications may need to be adjusted 3)Limit your Fluid  intake to no more than 60 ounces (1.8 Liters) per day 4)Reduce Losartan to 25 mg daily from 100 mg daily, also reduce carvedilol/Coreg to 6.25 mg twice a day from 25 mg twice a day due to soft BP/blood pressure 5)Continue Lasix/furosemide and all other medications as advised   10/01/18 1259    Call MD for:  temperature >100.4     10/01/18 1259    Call MD for:  persistant nausea and vomiting     10/01/18 1259    Call MD for:  severe uncontrolled pain     10/01/18 1259    Call MD for:  difficulty breathing, headache or visual disturbances     10/01/18 1259    Discharge instructions    Comments:  1)Very low-salt diet advised 2)Weigh yourself daily, call if you gain more than 3 pounds in 1 day or more than 5 pounds in 1 week as your diuretic medications may need to be adjusted 3)Limit your Fluid  intake to no more than 60 ounces (1.8 Liters) per day 4)Reduce Losartan to 25 mg daily from 100 mg daily, also reduce carvedilol/Coreg to 6.25 mg twice a day from 25 mg twice a day due to soft BP/blood pressure 5)Continue Lasix/furosemide and all other medications as advised 6)You are Taking Xarelxo/rivaroxaban-----so Avoid ibuprofen/Advil/Aleve/Motrin/Goody Powders/Naproxen/BC powders/Meloxicam/Diclofenac/Indomethacin and other Nonsteroidal anti-inflammatory medications as these will make you more likely to bleed and can cause stomach ulcers, can also cause Kidney problems.   10/01/18 1304           Drenda Freeze, MD 10/02/18 1504

## 2018-09-30 NOTE — H&P (Signed)
History and Physical    [name]:[id number] DOB: 12-01-1929 DOA: 09/30/2018  PCP: Maury Dus, MD  Patient coming from: Home  I have personally briefly reviewed patient's old medical records in [place]  Chief Complaint: Near syncope  HPI: [name] is a 85 y.o. female with medical history significant for chronic systolic heart failure, CAD, atrial fibrillation on Xarelto, hypertension, hyperlipidemia, CKD stage III, hx of CVA w/ residual left-sided weakness, and hypothyroidism who presents to the ED reported near syncope, altered mental status, and speech difficulty.    Patient apparently had some confusion around 12 PM day of admission 09/30/2018.  She went to sleep in the afternoon and daughter saw her at home around 5 PM at which time she appeared at her baseline.  Her son came to the house and saw her around 8 PM and noted that she was very confused and disoriented.  She apparently stood up and had a near syncopal episode without loss of consciousness or fall.  She was also noted to have trouble speaking at the time.  Due to these changes she was brought to the ED for further evaluation.  Patient currently denies any chest pain, palpitations, dyspnea, abdominal pain, dysuria, diarrhea, nausea, vomiting.    She says she is taking Xarelto for atrial fibrillation, previously on Coumadin but had difficulty with keeping in therapeutic range.  ED Course:  Vitals in the ED showed BP 117/91, pulse 102, RR 19, SPO2 99% on room air.  Labs are notable for troponin I 0.08, INR 4.8, serum glucose 100, BUN 25, creatinine 2.4 (most recent creatinine 1.23 January 2016), negative ethanol level.  CBC was unremarkable.  EKG showed atrial fibrillation with controlled rate.  Portable chest x-ray showed enlarged cardiac silhouette, interstitial congestion, and aortic atherosclerosis.  CT head without contrast showed mild chronic ischemic white matter disease and mild diffuse cortical  atrophy without evidence of acute intracranial abnormality.  Neurology were consulted and recommended MRI brain to evaluate for questionable new dysarthria without further neurological work-up if MRI negative.  MRI brain was obtained and was negative for acute intracranial process, showed moderate chronic small vessel ischemic changes and old right basal ganglia infarct, exam was motion degraded.  The hospitalist service was consulted to admit for further evaluation and management of presyncope and elevated troponin.  Review of Systems: As per HPI otherwise 10 point review of systems negative.    Past Medical History:  Diagnosis Date   Atrial fibrillation ([place])    CAD (coronary artery disease) 06/2010 cath   Occluded distal RCA, moderately diffuse LCx and LAD disease out of proportion to LV dysfxn, and significant MR   CHF (congestive heart failure) (HCC)    EF 20-25% and severe MR    CKD (chronic kidney disease)    Baseline Cr 1.1-1.2    CVA (cerebral infarction)    Left hemiparesis   Hyperlipidemia    Hypertension    Hypothyroidism    Torsades de pointes (HCC)    Moxifloxacin induced    Venous insufficiency    Chronic LE edema    Past Surgical History:  Procedure Laterality Date   CHOLECYSTECTOMY       reports that she has quit smoking. She has never used smokeless tobacco. She reports that she does not drink alcohol or use drugs.  Allergies  Allergen Reactions   Avelox [Moxifloxacin Hcl In Nacl] Other (See Comments)    "didn't know what was going on, lost 3 days"  Doxycycline Hives, Itching and Rash    Family History  Problem Relation Age of Onset   Cancer Brother      Prior to Admission medications   Medication Sig Start Date End Date Taking? Authorizing Provider  alendronate (FOSAMAX) 70 MG tablet Take 70 mg by mouth once a week. thursday 01/07/16   [provider]  atorvastatin (LIPITOR) 80 MG tablet Take 40 mg by mouth every other day.      [provider]  calcium carbonate (OSCAL) 1500 (600 Ca) MG TABS tablet Take 1,500 mg by mouth 2 (two) times daily with a meal.    [provider]  carvedilol (COREG) 25 MG tablet Take 25 mg by mouth 2 (two) times daily with a meal.    [provider]  ferrous gluconate (FERGON) 324 MG tablet Take 1 tablet (324 mg total) by mouth daily with breakfast. 01/24/16   Caren Griffins, MD  furosemide (LASIX) 40 MG tablet Take 40 mg by mouth daily.    [provider]  levothyroxine (SYNTHROID, LEVOTHROID) 125 MCG tablet Take 125 mcg by mouth daily before breakfast.    [provider]  losartan (COZAAR) 100 MG tablet Take 100 mg by mouth daily.    [provider]  LUTEIN-ZEAXANTHIN PO Take 1 tablet by mouth 2 (two) times daily.    [provider]  oxybutynin (DITROPAN) 5 MG tablet Take 5 mg by mouth 3 (three) times daily. Bladder spasms    [provider]  potassium chloride SA (K-DUR,KLOR-CON) 20 MEQ tablet Take 20 mEq by mouth daily.    [provider]  senna-docusate (SENOKOT-S) 8.6-50 MG tablet Take 1 tablet by mouth at bedtime as needed for mild constipation. 01/24/16   Caren Griffins, MD  warfarin (COUMADIN) 1 MG tablet Take 1 mg by mouth as directed. Take 1mg  by mouth once daily on Tuesday, Thursday, and Saturdays    [provider]  warfarin (COUMADIN) 2 MG tablet Take 2 mg by mouth as directed. Take 2mg  by mouth once daily on Sunday, Monday, Wednesdays and Friday    [provider]  XARELTO 20 MG TABS tablet Take 20 mg by mouth every evening. 07/30/18   [provider]    Physical Exam: Vitals:   09/30/18 2215 09/30/18 2300 09/30/18 2315 09/30/18 2330  BP: 99/78 (!) 117/91  103/65  Pulse: (!) 106 (!) 48    Resp: 12 19 14    SpO2: 94% 99%  97%  Weight:      Height:        Constitutional: Elderly woman resting supine in bed, NAD, calm, comfortable Eyes: PERRL, EOMI, lids and  conjunctivae normal ENMT: Mucous membranes are dry. Posterior pharynx clear of any exudate or lesions.Normal dentition.  Neck: normal, supple, no masses. Respiratory: clear to auscultation bilaterally, no wheezing, no crackles. Normal respiratory effort. No accessory muscle use.  Cardiovascular: Irregularly irregular, no murmurs.  Nonpitting edema both legs.  Abdomen: no tenderness, no masses palpated. No hepatosplenomegaly. Bowel sounds positive.  Musculoskeletal: no clubbing / cyanosis. No joint deformity upper and lower extremities.  Decreased ROM of left upper extremity and left lower extremity, no contractures. Normal muscle tone.  Skin: no rashes, lesions, ulcers. No induration Neurologic: CN 2-12 grossly intact. Sensation intact, Strength 5/5 in RUE and RLE, strength 4/5 LUE and LLE.  Psychiatric: Alert and oriented to person, place, year, month, and date of birth. Normal mood.     Labs on Admission: I have personally reviewed  following labs and imaging studies  CBC: Recent Labs  Lab 09/30/18 2101  WBC 7.6  NEUTROABS 5.2  HGB 12.5  HCT 39.3  MCV 92.5  PLT 469   Basic Metabolic Panel: Recent Labs  Lab 09/30/18 2101 09/30/18 2105  NA 137  --   K 4.3  --   CL 104  --   CO2 24  --   GLUCOSE 100*  --   BUN 25*  --   CREATININE 2.40* 2.50*  CALCIUM 9.5  --    GFR: Estimated Creatinine Clearance: 18.1 mL/min (A) (by C-G formula based on SCr of 2.5 mg/dL (H)). Liver Function Tests: Recent Labs  Lab 09/30/18 2101  AST 15  ALT 12  ALKPHOS 102  BILITOT 1.0  PROT 6.9  ALBUMIN 3.3*   No results for input(s): LIPASE, AMYLASE in the last 168 hours. No results for input(s): AMMONIA in the last 168 hours. Coagulation Profile: Recent Labs  Lab 09/30/18 2101  INR 4.8*   Cardiac Enzymes: Recent Labs  Lab 09/30/18 2101  TROPONINI 0.08*   BNP (last 3 results) No results for input(s): PROBNP in the last 8760 hours. HbA1C: No results for input(s): HGBA1C in the  last 72 hours. CBG: No results for input(s): GLUCAP in the last 168 hours. Lipid Profile: No results for input(s): CHOL, HDL, LDLCALC, TRIG, CHOLHDL, LDLDIRECT in the last 72 hours. Thyroid Function Tests: No results for input(s): TSH, T4TOTAL, FREET4, T3FREE, THYROIDAB in the last 72 hours. Anemia Panel: No results for input(s): VITAMINB12, FOLATE, FERRITIN, TIBC, IRON, RETICCTPCT in the last 72 hours. Urine analysis:    Component Value Date/Time   COLORURINE YELLOW 12/12/2011 2221   APPEARANCEUR CLOUDY (A) 12/12/2011 2221   LABSPEC 1.018 12/12/2011 2221   PHURINE 5.5 12/12/2011 2221   GLUCOSEU NEGATIVE 12/12/2011 2221   HGBUR LARGE (A) 12/12/2011 2221   BILIRUBINUR NEGATIVE 12/12/2011 2221   KETONESUR NEGATIVE 12/12/2011 2221   PROTEINUR NEGATIVE 12/12/2011 2221   UROBILINOGEN 0.2 12/12/2011 2221   NITRITE POSITIVE (A) 12/12/2011 2221   LEUKOCYTESUR LARGE (A) 12/12/2011 2221    Radiological Exams on Admission: Ct Head Wo Contrast  Result Date: 09/30/2018 CLINICAL DATA:  Altered level of consciousness. EXAM: CT HEAD WITHOUT CONTRAST TECHNIQUE: Contiguous axial images were obtained from the base of the skull through the vertex without intravenous contrast. COMPARISON:  None. FINDINGS: Brain: Mild chronic ischemic white matter disease is noted. Mild diffuse cortical atrophy is noted. No mass effect or midline shift is noted. Ventricular size is within normal limits. There is no evidence of mass lesion, hemorrhage or acute infarction. Vascular: No hyperdense vessel or unexpected calcification. Skull: Normal. Negative for fracture or focal lesion. Sinuses/Orbits: No acute finding. Other: None. IMPRESSION: Mild chronic ischemic white matter disease. Mild diffuse cortical atrophy. No acute intracranial abnormality seen. Electronically Signed   By: Marijo Conception, M.D.   On: 09/30/2018 21:18   Mr Brain Wo Contrast  Result Date: 09/30/2018 CLINICAL DATA:  Dysarthria. History of stroke and  LEFT hemiparesis, atrial fibrillation, hypertension and hyperlipidemia. EXAM: MRI HEAD WITHOUT CONTRAST TECHNIQUE: Multiplanar, multiecho pulse sequences of the brain and surrounding structures were obtained without intravenous contrast. COMPARISON:  CT HEAD September 30, 2018 FINDINGS: Moderately motion degraded examination. INTRACRANIAL CONTENTS: No reduced diffusion to suggest acute ischemia. No susceptibility artifact to suggest hemorrhage. Basal ganglia mineralization. No parenchymal brain volume loss for age. No hydrocephalus. Old small RIGHT basal ganglia infarct with mild ex vacuo dilatation subjacent ventricle. Patchy supratentorial white  matter FLAIR T2 hyperintensities. No suspicious parenchymal signal, masses, mass effect. No abnormal extra-axial fluid collections. No extra-axial masses. VASCULAR: Normal major intracranial vascular flow voids present at skull base. SKULL AND UPPER CERVICAL SPINE: No abnormal sellar expansion. No suspicious calvarial bone marrow signal. Craniocervical junction maintained. SINUSES/ORBITS: LEFT maxillary mucosal retention cyst. Mastoid air cells are well aerated.The included ocular globes and orbital contents are non-suspicious. Status post bilateral ocular lens implants. OTHER: None. IMPRESSION: 1. No acute intracranial process on this moderately motion degraded examination. 2. Moderate chronic small vessel ischemic changes and old RIGHT basal ganglia infarct. Electronically Signed   By: Elon Alas M.D.   On: 09/30/2018 22:54   Dg Chest Port 1 View  Result Date: 09/30/2018 CLINICAL DATA:  Initial evaluation for acute altered mental status, weakness. EXAM: PORTABLE CHEST 1 VIEW COMPARISON:  Prior radiograph from 01/21/2016. FINDINGS: Advanced cardiomegaly, stable. Mediastinal silhouette within normal limits. Aortic atherosclerosis. Lungs are hypoinflated with secondary bibasilar bronchovascular crowding. Scattered diffuse peribronchial thickening, likely reflecting  mild interstitial congestion. No consolidative opacity. No pleural effusion. No pneumothorax. No acute osseous finding. Degenerative changes noted about the right shoulder. IMPRESSION: 1. Advanced cardiomegaly with mild diffuse interstitial congestion without overt pulmonary edema. 2. Aortic atherosclerosis. Electronically Signed   By: Jeannine Boga M.D.   On: 09/30/2018 21:57    EKG: Independently reviewed.  A. fib, rate 90 bpm.  Assessment/Plan Principal Problem:   Near syncope Active Problems:   Atrial fibrillation (HCC)   Chronic systolic CHF (congestive heart failure) (HCC)   Hypothyroidism   Acute-on-chronic kidney injury (HCC)   Elevated troponin   Supratherapeutic INR   History of cerebrovascular accident (CVA) with residual deficit   CAD (coronary artery disease)  [name] is a 85 y.o. female with medical history significant for chronic systolic heart failure, CAD, atrial fibrillation on Xarelto, hypertension, hyperlipidemia, CKD stage III, hx of CVA w/ residual left-sided weakness, and hypothyroidism who is admitted after a suspected presyncopal event with questionable dysarthria and mental status change.  Near syncope/AMS/speech difficulty: Suspect hypotensive episode secondary to dehydration.  MRI brain negative for acute CVA. -Admit to telemetry -Gentle IV fluids overnight -Obtain orthostatic vitals -PT/OT eval -Follow-up urinalysis, UDS -Hold home BP meds  Acute on chronic stage III kidney injury: Suspect prerenal from dehydration, although no recent labs within the last 2 years available. -Gentle IV fluids as above -Holding losartan, Lasix  Atrial fibrillation: Chronic atrial fibrillation on Xarelto for anticoagulation. -Rate currently controlled, hold Coreg for now -Hold Xarelto for now with elevated PT/INR  Elevated troponin and history of CAD: Troponin 0.08 on admission.  Cardiac cath January 2012 showed an occluded distal RCA and left to right  collaterals, moderately diffuse disease involving the LAD and circumflex.  Patient denies any active chest pain. -Trend troponin  Chronic systolic CHF: Most recent EF 20-25% with significant mitral regurgitation based on echocardiogram documentation from January 2012.  Volume status appears to be stable to mildly volume depleted on admission. -Holding Coreg, Lasix, lisinopril as above -Obtain echocardiogram -Daily weights and strict I/O's  Hypertension: Patient borderline hypotensive on admission. -Obtain orthostatic vital signs, holding home BP meds for now  Hyperlipidemia: -Continue atorvastatin  Hypothyroidism: -Continue Synthroid  History of CVA with residual left-sided weakness: Appears to be chronic and stable.  MRI brain negative for acute infarct. -PT/OT eval as above  DVT prophylaxis: SCDs Code Status: DNR, discussed with patient Family Communication: None present at bedside on admission Disposition Plan: Pending clinical progress Consults  called: None Admission status: Observation   Zada Finders MD [facility] Pager [phone]  If 7PM-7AM, please contact night-coverage [email or web address]  10/01/2018, 12:39 AM

## 2018-09-30 NOTE — ED Notes (Signed)
Dr. Kirkpatrick at bedside 

## 2018-09-30 NOTE — ED Triage Notes (Signed)
Pt from via EMS c/o general weakness that began at 7:30. Pt denies pain per EMS. Family reports pt was out of it, not like herself. Family reports Pt stated she felt like she was about to pass out. Able to answer questions and follow commands for EMS. Per EMS pt has been Afib on monitor BP 203 systolic, HR in the 55H and O2 sat in the 90s. Weakness noted on left side from prior stroke. Pt c/o of [place] enroute with EMS.

## 2018-09-30 NOTE — ED Notes (Signed)
ED TO INPATIENT HANDOFF REPORT  ED Nurse Name and Phone #: Donah Driver [phone]  S Name/Age/Gender [name] 85 y.o. female Room/Bed: 033C/033C  Code Status   Code Status: Prior  Home/SNF/Other Home Patient oriented to: self, place, time and situation Is this baseline? Yes   Triage Complete: Triage complete  Chief Complaint general weakness  Triage Note Pt from via EMS c/o general weakness that began at 7:30. Pt denies pain per EMS. Family reports pt was out of it, not like herself. Family reports Pt stated she felt like she was about to pass out. Able to answer questions and follow commands for EMS. Per EMS pt has been Afib on monitor BP 188 systolic, HR in the 41Y and O2 sat in the 90s. Weakness noted on left side from prior stroke. Pt c/o of [place] enroute with EMS.   Allergies Allergies  Allergen Reactions  . Avelox [Moxifloxacin Hcl In Nacl] Other (See Comments)    "didn't know what was going on, lost 3 days"  . Doxycycline Hives, Itching and Rash    Level of Care/Admitting Diagnosis ED Disposition    ED Disposition Condition [facility] Area: [place] [[id number]]  Level of Care: Telemetry Cardiac [103]  I expect the patient will be discharged within 24 hours: Yes  LOW acuity---Tx typically complete <24 hrs---ACUTE conditions typically can be evaluated <24 hours---LABS likely to return to acceptable levels <24 hours---IS near functional baseline---EXPECTED to return to current living arrangement---NOT newly hypoxic: Meets criteria for 5C-Observation unit  Diagnosis: Near syncope [606301]  Admitting Physician: Lenore Cordia [[id number]]  Attending Physician: Lenore Cordia [[id number]]  PT Class (Do Not Modify): Observation [104]  PT Acc Code (Do Not Modify): Observation [10022]       B Medical/Surgery History Past Medical History:  Diagnosis Date  . Atrial fibrillation ([place])   . CAD (coronary artery disease) 06/2010 cath    Occluded distal RCA, moderately diffuse LCx and LAD disease out of proportion to LV dysfxn, and significant MR  . CHF (congestive heart failure) (H[facility])    EF 20-25% and severe MR   . CKD (chronic kidney disease)    Baseline Cr 1.1-1.2   . CVA (cerebral infarction)    Left hemiparesis  . Hyperlipidemia   . Hypertension   . Hypothyroidism   . 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 Venous insufficiency    Chronic LE edema   Past Surgical History:  Procedure Laterality Date  . CHOLECYSTECTOMY       A IV Location/Drains/Wounds Patient Lines/Drains/Airways Status   Active Line/Drains/Airways    Name:   Placement date:   Placement time:   Site:   Days:   Peripheral IV 01/22/16 Left;Anterior Forearm   01/22/16    0142    Forearm   982   Peripheral IV 09/30/18 Right Antecubital   09/30/18    2021    Antecubital   less than 1          Intake/Output Last 24 hours No intake or output data in the 24 hours ending 09/30/18 2335  Labs/Imaging Results for orders placed or performed during the hospital encounter of 09/30/18 (from the past 48 hour(s))  Ethanol     Status: None   Collection Time: 09/30/18  9:01 PM  Result Value Ref Range   Alcohol, Ethyl (B) <10 <10 mg/dL    Comment: (NOTE) Lowest detectable limit for serum alcohol is  10 mg/dL. For medical purposes only. Performed at [facility], [place] [address], [place], [place] [address]   Protime-INR     Status: Abnormal   Collection Time: 09/30/18  9:01 PM  Result Value Ref Range   Prothrombin Time 44.3 (H) 11.4 - 15.2 seconds   INR 4.8 (HH) 0.8 - 1.2    Comment: REPEATED TO VERIFY SPECIMEN CHECKED FOR CLOTS CRITICAL RESULT CALLED TO, READ BACK BY AND VERIFIED WITH: T PHILLIPS RN [id number] [phone] SHORTT (NOTE) INR goal varies based on device and disease states. Performed at [facility], [place] [address], [place], [place] [address]   APTT     Status: Abnormal   Collection Time: 09/30/18  9:01 PM   Result Value Ref Range   aPTT 59 (H) 24 - 36 seconds    Comment:        IF BASELINE aPTT IS ELEVATED, SUGGEST PATIENT RISK ASSESSMENT BE USED TO DETERMINE APPROPRIATE ANTICOAGULANT THERAPY. REPEATED TO VERIFY Performed at [facility], [place] [address] [place], [place] [address]   CBC     Status: None   Collection Time: 09/30/18  9:01 PM  Result Value Ref Range   WBC 7.6 4.0 - 10.5 K/uL   RBC 4.25 3.87 - 5.11 MIL/uL   Hemoglobin 12.5 12.0 - 15.0 g/dL   HCT 39.3 36.0 - 46.0 %   MCV 92.5 80.0 - 100.0 fL   MCH 29.4 26.0 - 34.0 pg   MCHC 31.8 30.0 - 36.0 g/dL   RDW 14.6 11.5 - 15.5 %   Platelets 169 150 - 400 K/uL   nRBC 0.0 0.0 - 0.2 %    Comment: Performed at [facility], [place] [address], [place], [place] [address]  Differential     Status: None   Collection Time: 09/30/18  9:01 PM  Result Value Ref Range   Neutrophils Relative % 68 %   Neutro Abs 5.2 1.7 - 7.7 K/uL   Lymphocytes Relative 20 %   Lymphs Abs 1.6 0.7 - 4.0 K/uL   Monocytes Relative 9 %   Monocytes Absolute 0.7 0.1 - 1.0 K/uL   Eosinophils Relative 2 %   Eosinophils Absolute 0.2 0.0 - 0.5 K/uL   Basophils Relative 0 %   Basophils Absolute 0.0 0.0 - 0.1 K/uL   Immature Granulocytes 1 %   Abs Immature Granulocytes 0.04 0.00 - 0.07 K/uL    Comment: Performed at [facility] [address] [place], [place] [address]  Comprehensive metabolic panel     Status: Abnormal   Collection Time: 09/30/18  9:01 PM  Result Value Ref Range   Sodium 137 135 - 145 mmol/L   Potassium 4.3 3.5 - 5.1 mmol/L   Chloride 104 98 - 111 mmol/L   CO2 24 22 - 32 mmol/L   Glucose, Bld 100 (H) 70 - 99 mg/dL   BUN 25 (H) 8 - 23 mg/dL   Creatinine, Ser 2.40 (H) 0.44 - 1.00 mg/dL   Calcium 9.5 8.9 - 10.3 mg/dL   Total Protein 6.9 6.5 - 8.1 g/dL   Albumin 3.3 (L) 3.5 - 5.0 g/dL   AST 15 15 - 41 U/L   ALT 12 0 - 44 U/L   Alkaline Phosphatase 102 38 - 126 U/L   Total Bilirubin 1.0 0.3 - 1.2 mg/dL   GFR calc non Af  Amer 18 (L) >60 mL/min   GFR calc Af Amer 20 (L) >60 mL/min  Anion gap 9 5 - 15    Comment: Performed at [facility] [address] [place], [place] [address]  Troponin I - ONCE - STAT     Status: Abnormal   Collection Time: 09/30/18  9:01 PM  Result Value Ref Range   Troponin I 0.08 (HH) <0.03 ng/mL    Comment: CRITICAL RESULT CALLED TO, READ BACK BY AND VERIFIED WITH: CRUZE,J RN 09/30/2018 2200 JORDANS Performed at [facility], [place] [address] [place], [place] [address]   I-stat Creatinine, ED     Status: Abnormal   Collection Time: 09/30/18  9:05 PM  Result Value Ref Range   Creatinine, Ser 2.50 (H) 0.44 - 1.00 mg/dL   Ct Head Wo Contrast  Result Date: 09/30/2018 CLINICAL DATA:  Altered level of consciousness. EXAM: CT HEAD WITHOUT CONTRAST TECHNIQUE: Contiguous axial images were obtained from the base of the skull through the vertex without intravenous contrast. COMPARISON:  None. FINDINGS: Brain: Mild chronic ischemic white matter disease is noted. Mild diffuse cortical atrophy is noted. No mass effect or midline shift is noted. Ventricular size is within normal limits. There is no evidence of mass lesion, hemorrhage or acute infarction. Vascular: No hyperdense vessel or unexpected calcification. Skull: Normal. Negative for fracture or focal lesion. Sinuses/Orbits: No acute finding. Other: None. IMPRESSION: Mild chronic ischemic white matter disease. Mild diffuse cortical atrophy. No acute intracranial abnormality seen. Electronically Signed   By: Marijo Conception, M.D.   On: 09/30/2018 21:18   Mr Brain Wo Contrast  Result Date: 09/30/2018 CLINICAL DATA:  Dysarthria. History of stroke and LEFT hemiparesis, atrial fibrillation, hypertension and hyperlipidemia. EXAM: MRI HEAD WITHOUT CONTRAST TECHNIQUE: Multiplanar, multiecho pulse sequences of the brain and surrounding structures were obtained without intravenous contrast. COMPARISON:  CT HEAD September 30, 2018 FINDINGS:  Moderately motion degraded examination. INTRACRANIAL CONTENTS: No reduced diffusion to suggest acute ischemia. No susceptibility artifact to suggest hemorrhage. Basal ganglia mineralization. No parenchymal brain volume loss for age. No hydrocephalus. Old small RIGHT basal ganglia infarct with mild ex vacuo dilatation subjacent ventricle. Patchy supratentorial white matter FLAIR T2 hyperintensities. No suspicious parenchymal signal, masses, mass effect. No abnormal extra-axial fluid collections. No extra-axial masses. VASCULAR: Normal major intracranial vascular flow voids present at skull base. SKULL AND UPPER CERVICAL SPINE: No abnormal sellar expansion. No suspicious calvarial bone marrow signal. Craniocervical junction maintained. SINUSES/ORBITS: LEFT maxillary mucosal retention cyst. Mastoid air cells are well aerated.The included ocular globes and orbital contents are non-suspicious. Status post bilateral ocular lens implants. OTHER: None. IMPRESSION: 1. No acute intracranial process on this moderately motion degraded examination. 2. Moderate chronic small vessel ischemic changes and old RIGHT basal ganglia infarct. Electronically Signed   By: Elon Alas M.D.   On: 09/30/2018 22:54   Dg Chest Port 1 View  Result Date: 09/30/2018 CLINICAL DATA:  Initial evaluation for acute altered mental status, weakness. EXAM: PORTABLE CHEST 1 VIEW COMPARISON:  Prior radiograph from 01/21/2016. FINDINGS: Advanced cardiomegaly, stable. Mediastinal silhouette within normal limits. Aortic atherosclerosis. Lungs are hypoinflated with secondary bibasilar bronchovascular crowding. Scattered diffuse peribronchial thickening, likely reflecting mild interstitial congestion. No consolidative opacity. No pleural effusion. No pneumothorax. No acute osseous finding. Degenerative changes noted about the right shoulder. IMPRESSION: 1. Advanced cardiomegaly with mild diffuse interstitial congestion without overt pulmonary edema. 2.  Aortic atherosclerosis. Electronically Signed   By: Jeannine Boga M.D.   On: 09/30/2018 21:57    Pending Labs Unresulted Labs (From admission, onward)  Start     Ordered   09/30/18 2136  Urine culture  ONCE - STAT,   STAT     09/30/18 2135   09/30/18 2043  Urine rapid drug screen (hosp performed)  ONCE - STAT,   STAT     09/30/18 2047   09/30/18 2043  Urinalysis, Routine w reflex microscopic  ONCE - STAT,   STAT     09/30/18 2047          Vitals/Pain Today's Vitals   09/30/18 2215 09/30/18 2300 09/30/18 2315 09/30/18 2330  BP: 99/78 (!) 117/91  103/65  Pulse: (!) 106 (!) 48    Resp: 12 19 14    SpO2: 94% 99%  97%  Weight:      Height:      PainSc:        Isolation Precautions No active isolations  Medications Medications  LORazepam (ATIVAN) injection 1 mg (1 mg Intravenous Not Given 09/30/18 2335)    Mobility walks with device Moderate fall risk   Focused Assessments Neuro Assessment Handoff:  Swallow screen pass? Yes    NIH Stroke Scale ( + Modified Stroke Scale Criteria)  Interval: Initial Level of Consciousness (1a.)   : Alert, keenly responsive LOC Questions (1b. )   +: Answers both questions correctly LOC Commands (1c. )   + : Performs both tasks correctly Best Gaze (2. )  +: Normal Visual (3. )  +: No visual loss Facial Palsy (4. )    : Normal symmetrical movements Motor Arm, Left (5a. )   +: No drift Motor Arm, Right (5b. )   +: No drift Motor Leg, Left (6a. )   +: No effort against gravity Motor Leg, Right (6b. )   +: No drift Limb Ataxia (7. ): Absent Sensory (8. )   +: Normal, no sensory loss Best Language (9. )   +: No aphasia Dysarthria (10. ): Mild-to-moderate dysarthria, patient slurs at least some words and, at worst, can be understood with some difficulty Extinction/Inattention (11.)   +: No Abnormality Modified SS Total  +: 3 Complete NIHSS TOTAL: 4   Last time known well: 1930 Neuro Assessment: Exceptions to WDL Neuro Checks:    Initial (09/30/18 2317)  Last Documented NIHSS Modified Score: 3 (09/30/18 2317) Has TPA been given? No If patient is a Neuro Trauma and patient is going to OR before floor call report to [facility] nurse: [phone] or [phone]     R Recommendations: See Admitting Provider Note  Report given to:   Additional Notes:

## 2018-09-30 NOTE — ED Notes (Signed)
Admitting provider at bedside.

## 2018-10-01 ENCOUNTER — Encounter (HOSPITAL_COMMUNITY): Payer: Self-pay | Admitting: Internal Medicine

## 2018-10-01 DIAGNOSIS — I2510 Atherosclerotic heart disease of native coronary artery without angina pectoris: Secondary | ICD-10-CM

## 2018-10-01 DIAGNOSIS — R55 Syncope and collapse: Secondary | ICD-10-CM | POA: Diagnosis not present

## 2018-10-01 LAB — URINALYSIS, ROUTINE W REFLEX MICROSCOPIC
Bilirubin Urine: NEGATIVE
Glucose, UA: NEGATIVE mg/dL
Ketones, ur: NEGATIVE mg/dL
Nitrite: NEGATIVE
Protein, ur: 30 mg/dL — AB
Specific Gravity, Urine: 1.014 (ref 1.005–1.030)
WBC, UA: >50 WBC/hpf — ABNORMAL HIGH (ref 0–5)
pH: 7 (ref 5.0–8.0)

## 2018-10-01 LAB — GLUCOSE, CAPILLARY
Glucose-Capillary: 112 mg/dL — ABNORMAL HIGH (ref 70–99)
Glucose-Capillary: 106 mg/dL — ABNORMAL HIGH (ref 70–99)

## 2018-10-01 LAB — CBC
HCT: 38.2 % (ref 36.0–46.0)
Hemoglobin: 12.2 g/dL (ref 12.0–15.0)
MCH: 28.9 pg (ref 26.0–34.0)
MCHC: 31.9 g/dL (ref 30.0–36.0)
MCV: 90.5 fL (ref 80.0–100.0)
Platelets: 118 10*3/uL — ABNORMAL LOW (ref 150–400)
RBC: 4.22 MIL/uL (ref 3.87–5.11)
RDW: 14.7 % (ref 11.5–15.5)
WBC: 7.7 10*3/uL (ref 4.0–10.5)
nRBC: 0 % (ref 0.0–0.2)

## 2018-10-01 LAB — BASIC METABOLIC PANEL
Anion gap: 13 (ref 5–15)
BUN: 25 mg/dL — ABNORMAL HIGH (ref 8–23)
CO2: 20 mmol/L — ABNORMAL LOW (ref 22–32)
Calcium: 9.5 mg/dL (ref 8.9–10.3)
Chloride: 103 mmol/L (ref 98–111)
Creatinine, Ser: 2.27 mg/dL — ABNORMAL HIGH (ref 0.44–1.00)
GFR calc Af Amer: 22 mL/min — ABNORMAL LOW (ref >60)
GFR calc non Af Amer: 19 mL/min — ABNORMAL LOW (ref >60)
Glucose, Bld: 108 mg/dL — ABNORMAL HIGH (ref 70–99)
Potassium: 4.4 mmol/L (ref 3.5–5.1)
Sodium: 136 mmol/L (ref 135–145)

## 2018-10-01 LAB — TSH: TSH: 0.44 u[IU]/mL (ref 0.350–4.500)

## 2018-10-01 LAB — RAPID URINE DRUG SCREEN, HOSP PERFORMED
Amphetamines: NOT DETECTED
Barbiturates: NOT DETECTED
Benzodiazepines: NOT DETECTED
Cocaine: NOT DETECTED
Opiates: NOT DETECTED
Tetrahydrocannabinol: NOT DETECTED

## 2018-10-01 LAB — TROPONIN I
Troponin I: 0.07 ng/mL (ref <0.03)
Troponin I: 0.08 ng/mL (ref <0.03)

## 2018-10-01 MED ORDER — ACETAMINOPHEN 650 MG RE SUPP: 650.0000 mg | Freq: Four times a day (QID) | RECTAL | Status: DC | PRN

## 2018-10-01 MED ORDER — ATORVASTATIN CALCIUM 40 MG PO TABS
40.0000 mg | ORAL_TABLET | ORAL | Status: DC
  Administered 2018-10-01: 40 mg via ORAL
  Filled 2018-10-01: qty 1

## 2018-10-01 MED ORDER — SENNOSIDES-DOCUSATE SODIUM 8.6-50 MG PO TABS: 2.0000 | ORAL_TABLET | Freq: Every day | ORAL | 2 refills | Status: DC

## 2018-10-01 MED ORDER — SODIUM CHLORIDE 0.9 % IV SOLN
INTRAVENOUS | Status: DC
  Administered 2018-10-01: 03:00:00 via INTRAVENOUS

## 2018-10-01 MED ORDER — ACETAMINOPHEN 325 MG PO TABS: 650.0000 mg | ORAL_TABLET | Freq: Four times a day (QID) | ORAL | 1 refills | Status: DC | PRN

## 2018-10-01 MED ORDER — CARVEDILOL 6.25 MG PO TABS: 6.2500 mg | ORAL_TABLET | Freq: Two times a day (BID) | ORAL | 3 refills | Status: DC

## 2018-10-01 MED ORDER — ACETAMINOPHEN 325 MG PO TABS: 650.0000 mg | ORAL_TABLET | Freq: Four times a day (QID) | ORAL | Status: DC | PRN

## 2018-10-01 MED ORDER — LOSARTAN POTASSIUM 25 MG PO TABS: 25.0000 mg | ORAL_TABLET | Freq: Every day | ORAL | 2 refills | Status: DC

## 2018-10-01 MED ORDER — LEVOTHYROXINE SODIUM 25 MCG PO TABS
125.0000 ug | ORAL_TABLET | Freq: Every day | ORAL | Status: DC
  Administered 2018-10-01: 125 ug via ORAL
  Filled 2018-10-01: qty 1

## 2018-10-01 MED ORDER — SODIUM CHLORIDE 0.9% FLUSH
3.0000 mL | Freq: Two times a day (BID) | INTRAVENOUS | Status: DC
  Administered 2018-10-01 (×2): 3 mL via INTRAVENOUS

## 2018-10-01 NOTE — Evaluation (Signed)
Physical Therapy Evaluation Patient Details Name: [name] MRN: [id number] DOB: 1930/05/21 Today's Date: 10/01/2018   History of Present Illness  85 y.o. female with medical history significant for chronic systolic heart failure, CAD, atrial fibrillation on Xarelto, hypertension, hyperlipidemia, CKD stage III, hx of CVA w/ residual left-sided weakness, and hypothyroidism who presented to the ED reported near syncope, altered mental status, and speech difficulty.CT head without contrast showed mild chronic ischemic white matter disease and mild diffuse cortical atrophy without evidence of acute intracranial abnormality.  Clinical Impression  Pt has minimal orientation but is oriented to self. Able to tell PT about her sons. Co-treated with OT and both were required to assist pt in standing. BM found and MaxA with distraction required to stay standing for cleaning, pt was bending forward and looking to the floor, not using arms for weight support. Able to shuffle her feet to her Left about 1 foot but then sat back in an uncontrolled manner. ModA required to bring legs back into bed and pt required cuing to lay with head at correct end of bed. Will benefit from mobility and gait training with AD from skilled PT.     Follow Up Recommendations SNF    Equipment Recommendations  Rolling walker with 5" wheels;Wheelchair (measurements PT);3in1 (PT)    Recommendations for Other Services       Precautions / Restrictions Precautions Precautions: Fall Restrictions Weight Bearing Restrictions: No      Mobility  Bed Mobility Overal bed mobility: Needs Assistance Bed Mobility: Supine to Sit;Sit to Supine     Supine to sit: Min guard Sit to supine: Mod assist   General bed mobility comments: Assist to bring LEs into bed when transitioning sit to supine. cues required for direction of HOB  Transfers Overall transfer level: Needs assistance Equipment used: Rolling walker (2 wheeled);2  person hand held assist Transfers: Sit to/from Stand Sit to Stand: +2 physical assistance;Max assist         General transfer comment: +2 max assist to power up from bed surface and for upright posture in standing. Completed sit to stand x 2.   Ambulation/Gait Ambulation/Gait assistance: Max assist;+2 physical assistance Gait Distance (Feet): 1 Feet Assistive device: Rolling walker (2 wheeled) Gait Pattern/deviations: Shuffle     General Gait Details: shuffle to complete Lt lateral stepping to move toward [facility]- reported "I can't" and quickly sat back to bed impulsively  Stairs            Wheelchair Mobility    Modified Rankin (Stroke Patients Only)       Balance Overall balance assessment: Needs assistance Sitting-balance support: Single extremity supported Sitting balance-Leahy Scale: Fair     Standing balance support: Bilateral upper extremity supported Standing balance-Leahy Scale: Poor Standing balance comment: x2 with RW required for assistance                             Pertinent Vitals/Pain Pain Assessment: No/denies pain    Home Living Family/patient expects to be discharged to:: Private residence Living Arrangements: Children Available Help at Discharge: Family;Available 24 hours/day Type of Home: House Home Access: Stairs to enter   [employer] of Steps: 2 Home Layout: One level Home Equipment: Walker - 2 wheels;Bedside commode;Wheelchair - manual Additional Comments: Pt reports her son has had multiple back surgeries and cannot provide physical assist. Her daughter also lives near by and drives her to get her hair done. pt  with minimal orientation so details are unclear but was consistent in discussing children.     Prior Function Level of Independence: Needs assistance      ADL's / Homemaking Assistance Needed: Pt reports she needs occasional assist from son.   Comments: Unable to confirm due to no family present at  bedside.      Hand Dominance   Dominant Hand: Right    Extremity/Trunk Assessment   Upper Extremity Assessment Upper Extremity Assessment: LUE deficits/detail LUE Deficits / Details: Left UE weakness throughout due to h/o CVA.  Hand in flexed position and pt has to manipulate left fingers using right hand in order to place left fingers around RW handle.     Lower Extremity Assessment Lower Extremity Assessment: LLE deficits/detail LLE Deficits / Details: gross 3+/5 LLE Sensation: (hypersensitive to plantar aspect of foot) LLE Coordination: decreased gross motor       Communication   Communication: No difficulties  Cognition Arousal/Alertness: Awake/alert Behavior During Therapy: WFL for tasks assessed/performed Overall Cognitive Status: Impaired/Different from baseline Area of Impairment: Memory;Following commands;Orientation;Problem solving                 Orientation Level: Disoriented to;Place;Time;Situation   Memory: Decreased short-term memory Following Commands: Follows one step commands with increased time     Problem Solving: Slow processing;Requires verbal cues General Comments: No family present to confirm baseline cognition.      General Comments      Exercises     Assessment/Plan    PT Assessment Patient needs continued PT services  PT Problem List Decreased strength;Decreased mobility;Decreased safety awareness;Decreased coordination;Decreased knowledge of precautions;Decreased activity tolerance;Decreased balance       PT Treatment Interventions DME instruction;Therapeutic activities;Gait training;Therapeutic exercise;Balance training;Functional mobility training    PT Goals (Current goals can be found in the Care Plan section)  Acute Rehab PT Goals Patient Stated Goal: go home PT Goal Formulation: With patient Time For Goal Achievement: 10/15/18 Potential to Achieve Goals: Fair    Frequency Min 2X/week   Barriers to discharge         Co-evaluation PT/OT/SLP Co-Evaluation/Treatment: Yes Reason for Co-Treatment: For patient/therapist safety;To address functional/ADL transfers   OT goals addressed during session: ADL's and self-care;Strengthening/ROM;Proper use of Adaptive equipment and DME       AM-PAC PT "6 Clicks" Mobility  Outcome Measure Help needed turning from your back to your side while in a flat bed without using bedrails?: None Help needed moving from lying on your back to sitting on the side of a flat bed without using bedrails?: A Little Help needed moving to and from a bed to a chair (including a wheelchair)?: A Lot Help needed standing up from a chair using your arms (e.g., wheelchair or bedside chair)?: A Lot Help needed to walk in hospital room?: Total Help needed climbing 3-5 steps with a railing? : Total 6 Click Score: 13    End of Session Equipment Utilized During Treatment: Gait belt Activity Tolerance: Patient tolerated treatment well Patient left: in bed;with bed alarm set;with call bell/phone within reach   PT Visit Diagnosis: Unsteadiness on feet (R26.81);Other abnormalities of gait and mobility (R26.89);Muscle weakness (generalized) (M62.81)    Time: 9024-0973 PT Time Calculation (min) (ACUTE ONLY): 20 min   Charges:   PT Evaluation $PT Eval High Complexity: 1 High          Selinda Eon PT, DPT Acute Rehab [phone]

## 2018-10-01 NOTE — Care Management (Signed)
1431 10-01-18 CM did call [facility] for ambulance transport to [facility]. Staff RN aware. PTAR aware that patient is ready for transition to SNF at this time. No further needs from CM at this time.

## 2018-10-01 NOTE — Progress Notes (Signed)
Attending cardiology review of echo order:  Due to the spread of COVID-19, departmental policy is to review orders for procedures and determine appropriateness regarding testing at this time. The following criteria are being used to limit potential exposure and spread of infection.  Is the patient being evaluated for COVID-19 infection: no  Is the patient actively having infectious respiratory symptoms or fever: not per documentation Does the patient have a known history of prior cardiovascular disease:yes, extensive Would the test change management of the patient: not at this time Can the test be performed at a later time: yes, as outpatient  Special circumstances: Is the patient undergoing evaluation for embolic CVA: no Is the patient planned for chemotherapy: no  Based on the review above, this study is not to be performed at this time. Patient has known reduced EF per documentation 2012 with EF 20-25%, multiple WMA, diastolic dysfunction, mild-mod MR, reduced RV function and elevated right sided pressures. Given that she has a known prior abnormal echo, would not change management of presyncope at this time.  Buford Dresser, MD, PhD [facility]  [address] [place], [place] [address] [phone]

## 2018-10-01 NOTE — Discharge Instructions (Signed)
1)Very low-salt diet advised 2)Weigh yourself daily, call if you gain more than 3 pounds in 1 day or more than 5 pounds in 1 week as your diuretic medications may need to be adjusted 3)Limit your Fluid  intake to no more than 60 ounces (1.8 Liters) per day 4)Reduce Losartan to 25 mg daily from 100 mg daily, also reduce carvedilol/Coreg to 6.25 mg twice a day from 25 mg twice a day due to soft BP/blood pressure 5)Continue Lasix/furosemide and all other medications as advised 6)You are Taking Xarelxo/rivaroxaban-----so Avoid ibuprofen/Advil/Aleve/Motrin/Goody Powders/Naproxen/BC powders/Meloxicam/Diclofenac/Indomethacin and other Nonsteroidal anti-inflammatory medications as these will make you more likely to bleed and can cause stomach ulcers, can also cause Kidney problems.

## 2018-10-01 NOTE — TOC Initial Note (Signed)
Transition of Care [facility]) - Initial/Assessment Note    Patient Details  Name: [name] MRN: [id number] Date of Birth: [birth year]  Transition of Care [facility]) CM/SW Contact:    Estanislado Emms, LCSW Phone Number: 10/01/2018, 11:49 AM  Clinical Narrative: CSW and CM spoke to patient's daughter, Jenny Reichmann, on the phone. Patient is not fully oriented. Jenny Reichmann reported that patient lives with patient's son (Cindy's brother), but patient has not been doing well at home. Patient's son recently had several back surgeries and uses a walker himself. He is not able to assist patient with ADLs.   Daughter reported that patient usually uses a walker, but has had difficulty with transfers and ambulation. CM reflected that patient now requiring max assist with PT. Daughter also reported that patient's mental status has been "weird" and she seems more confused. Daughter would like for patient to go to rehab at [facility] and prefers [facility].    CM sent initial referrals and [facility] accepted. Daughter will do paperwork at the facility at 2:15 pm today. Once paperwork completed, TOC to assist with discharge.                  Expected Discharge Plan: Skilled Nursing Facility Barriers to Discharge: Continued Medical Work up   Patient Goals and CMS Choice   CMS Medicare.gov Compare Post Acute Care list provided to:: Patient Represenative (must comment)(daughter, Jenny Reichmann) Choice offered to / list presented to : Adult Children  Expected Discharge Plan and Services Expected Discharge Plan: [place] Acute Care Choice: [place] Living arrangements for the past 2 months: Single Family Home                          Prior Living Arrangements/Services Living arrangements for the past 2 months: Single Family Home Lives with:: Adult Children Patient language and need for interpreter reviewed:: Yes Do you feel safe going back to the place where you live?: Yes      Need for  Family Participation in Patient Care: Yes (Comment) Care giver support system in place?: Yes (comment)   Criminal Activity/Legal Involvement Pertinent to Current Situation/Hospitalization: No - Comment as needed  Activities of Daily Living Home Assistive Devices/Equipment: Walker (specify type) ADL Screening (condition at time of admission) Patient's cognitive ability adequate to safely complete daily activities?: Yes Is the patient deaf or have difficulty hearing?: No Does the patient have difficulty seeing, even when wearing glasses/contacts?: No Does the patient have difficulty concentrating, remembering, or making decisions?: No Patient able to express need for assistance with ADLs?: Yes Does the patient have difficulty dressing or bathing?: No Independently performs ADLs?: No Communication: Independent Dressing (OT): Needs assistance Is this a change from baseline?: Pre-admission baseline Grooming: Needs assistance Is this a change from baseline?: Pre-admission baseline Feeding: Needs assistance Is this a change from baseline?: Pre-admission baseline Bathing: Needs assistance Is this a change from baseline?: Pre-admission baseline Toileting: Needs assistance Is this a change from baseline?: Pre-admission baseline In/Out Bed: Needs assistance Is this a change from baseline?: Pre-admission baseline Walks in Home: Needs assistance Is this a change from baseline?: Pre-admission baseline Does the patient have difficulty walking or climbing stairs?: Yes Weakness of Legs: Left Weakness of Arms/Hands: Left  Permission Sought/Granted Permission sought to share information with : Family Supports Permission granted to share information with : No(patient is not oriented)  Share Information with NAME: Sharlet Salina  Permission granted to share  info w AGENCY: SNF  Permission granted to share info w Relationship: daughter  Permission granted to share info w Contact Information:  [phone]  Emotional Assessment Appearance:: Appears stated age Attitude/Demeanor/Rapport: Unable to Assess Affect (typically observed): Unable to Assess Orientation: : Oriented to Self Alcohol / Substance Use: Not Applicable Psych Involvement: No (comment)  Admission diagnosis:  Encephalopathy [G93.40] Elevated troponin [R79.89] Patient Active Problem List   Diagnosis Date Noted  . CAD (coronary artery disease) 10/01/2018  . Near syncope 09/30/2018  . Acute-on-chronic kidney injury ([place]) 09/30/2018  . Elevated troponin 09/30/2018  . Supratherapeutic INR 09/30/2018  . History of cerebrovascular accident (CVA) with residual deficit 09/30/2018  . Anemia 01/21/2016  . Left-sided chest wall pain 01/21/2016  . CKD (chronic kidney disease), stage III ([place]) 01/21/2016  . Hypothyroidism 01/21/2016  . Atrial fibrillation ([place])   . Chronic systolic CHF (congestive heart failure) ([place])    PCP:  Maury Dus, MD Pharmacy:   [id number] - [place], [place] [address] Phone: [phone] Fax: [phone]     Social Determinants of Health (SDOH) Interventions    Readmission Risk Interventions No flowsheet data found.

## 2018-10-01 NOTE — TOC Transition Note (Signed)
Transition of Care [facility]) - CM/SW Discharge Note   Patient Details  Name: [name] MRN: [id number] Date of Birth: 1929-10-28  Transition of Care [facility]) CM/SW Contact:  Estanislado Emms, LCSW Phone Number: 10/01/2018, 2:22 PM   Clinical Narrative:     Patient will discharge to [facility]. Nurse to call report to [phone]. Patient will go to room 3239 at the facility.    Final next level of care: Skilled Nursing Facility Barriers to Discharge: Continued Medical Work up   Patient Goals and CMS Choice   CMS Medicare.gov Compare Post Acute Care list provided to:: Patient Represenative (must comment)(daughter, Jenny Reichmann) Choice offered to / list presented to : Adult Children  Discharge Placement                       Discharge Plan and Services     Post Acute Care Choice: Skilled Nursing Facility                    Social Determinants of Health (SDOH) Interventions     Readmission Risk Interventions No flowsheet data found.

## 2018-10-01 NOTE — Progress Notes (Signed)
Occupational Therapy Evaluation Patient Details Name: [name] MRN: [id number] DOB: 08-12-1929 Today's Date: 10/01/2018    History of Present Illness 85 y.o. female with medical history significant for chronic systolic heart failure, CAD, atrial fibrillation on Xarelto, hypertension, hyperlipidemia, CKD stage III, hx of CVA w/ residual left-sided weakness, and hypothyroidism who presented to the ED reported near syncope, altered mental status, and speech difficulty.CT head without contrast showed mild chronic ischemic white matter disease and mild diffuse cortical atrophy without evidence of acute intracranial abnormality.   Clinical Impression   Pt admitted due to above deficits.  Pt oriented x 1( person) and presenting with short term memory deficits and poor processing.  Difficult to determine if this is baseline cognition due to no family present at bedside.  Pt reports she lives at home with her son, who cannot provide much physical assist, and that she uses a RW for functional mobility.  During evaluation, pt required +2 max assist to complete sit<>stand and to take small steps toward [facility].  She had loose stool in bed and required total assist for back peri hygiene.  Pt will benefit from acute OT services to maximize safety and independence with ADLs. Recommending SNF for discharge planning in order to further progress rehab before eventual return home.     Follow Up Recommendations  SNF;Supervision/Assistance - 24 hour    Equipment Recommendations  Other (comment)(defer to next venue)    Recommendations for Other Services       Precautions / Restrictions Precautions Precautions: Fall Restrictions Weight Bearing Restrictions: No      Mobility Bed Mobility Overal bed mobility: Needs Assistance Bed Mobility: Supine to Sit;Sit to Supine     Supine to sit: Min guard Sit to supine: Mod assist   General bed mobility comments: Assist to bring LEs into bed when transitioning  sit to supine.   Transfers Overall transfer level: Needs assistance Equipment used: Rolling walker (2 wheeled) Transfers: Sit to/from Stand Sit to Stand: +2 physical assistance;Max assist         General transfer comment: +2 max assist to power up from bed surface and for upright posture in standing. Completed sit to stand x 2.     Balance                                           ADL either performed or assessed with clinical judgement   ADL Overall ADL's : Needs assistance/impaired     Grooming: Brushing hair;Supervision/safety;Sitting   Upper Body Bathing: Minimal assistance;Sitting   Lower Body Bathing: Maximal assistance;Sitting/lateral leans   Upper Body Dressing : Minimal assistance;Sitting   Lower Body Dressing: Maximal assistance;Sitting/lateral leans   Toilet Transfer: +2 for physical assistance;Maximal assistance;RW Toilet Transfer Details (indicate cue type and reason): simulated with standing at bedside and taking small steps toward HOB. Toileting- Clothing Manipulation and Hygiene: +2 for physical assistance;Total assistance;Sit to/from stand Toileting - Clothing Manipulation Details (indicate cue type and reason): total assist for back peri hygiene due to loose stool in bed     Functional mobility during ADLs: +2 for physical assistance;Maximal assistance       Vision Patient Visual Report: No change from baseline       Perception     Praxis      Pertinent Vitals/Pain Pain Assessment: No/denies pain     Hand Dominance Right   Extremity/Trunk  Assessment Upper Extremity Assessment Upper Extremity Assessment: LUE deficits/detail LUE Deficits / Details: Left UE weakness throughout due to h/o CVA.  Hand in flexed position and pt has to manipulate left fingers using right hand in order to place left fingers around RW handle.    Lower Extremity Assessment Lower Extremity Assessment: Defer to PT evaluation       Communication  Communication Communication: No difficulties   Cognition Arousal/Alertness: Awake/alert Behavior During Therapy: WFL for tasks assessed/performed Overall Cognitive Status: Impaired/Different from baseline Area of Impairment: Memory;Following commands;Orientation;Problem solving                 Orientation Level: Disoriented to;Place;Time;Situation   Memory: Decreased short-term memory Following Commands: Follows one step commands with increased time     Problem Solving: Slow processing;Requires verbal cues General Comments: No family present to confirm baseline cognition.   General Comments       Exercises     Shoulder Instructions      Home Living Family/patient expects to be discharged to:: Private residence Living Arrangements: Children Available Help at Discharge: Family;Available 24 hours/day Type of Home: House Home Access: Stairs to enter [employer] of Steps: 2   Home Layout: One level     Bathroom Shower/Tub: Occupational psychologist: Standard     Home Equipment: Environmental consultant - 2 wheels;Bedside commode;Wheelchair - manual   Additional Comments: Pt reports her son has had multiple back surgeries and cannot provide physical assist. Her daughter also lives near by and drives her to get her hair done.      Prior Functioning/Environment Level of Independence: Needs assistance    ADL's / Homemaking Assistance Needed: Pt reports she needs occasional assist from son.    Comments: Unable to confirm due to no family present at bedside.         OT Problem List: Decreased strength;Decreased activity tolerance;Impaired balance (sitting and/or standing);Decreased knowledge of use of DME or AE;Decreased cognition;Impaired UE functional use      OT Treatment/Interventions: Self-care/ADL training;Therapeutic exercise;DME and/or AE instruction;Therapeutic activities;Patient/family education;Balance training    OT Goals(Current goals can be found  in the care plan section) Acute Rehab OT Goals Patient Stated Goal: to go home OT Goal Formulation: With patient Time For Goal Achievement: 10/15/18 Potential to Achieve Goals: Fair  OT Frequency: Min 2X/week   Barriers to D/C:            Co-evaluation PT/OT/SLP Co-Evaluation/Treatment: Yes Reason for Co-Treatment: For patient/therapist safety;To address functional/ADL transfers   OT goals addressed during session: ADL's and self-care;Strengthening/ROM;Proper use of Adaptive equipment and DME      AM-PAC OT "6 Clicks" Daily Activity     Outcome Measure Help from another person eating meals?: A Little Help from another person taking care of personal grooming?: A Little Help from another person toileting, which includes using toliet, bedpan, or urinal?: Total Help from another person bathing (including washing, rinsing, drying)?: A Lot Help from another person to put on and taking off regular upper body clothing?: A Little Help from another person to put on and taking off regular lower body clothing?: Total 6 Click Score: 13   End of Session Equipment Utilized During Treatment: Gait belt;Rolling walker Nurse Communication: (pt needing new pure wick)  Activity Tolerance: Patient tolerated treatment well Patient left: in bed;with call bell/phone within reach;with bed alarm set  OT Visit Diagnosis: Unsteadiness on feet (R26.81);Other abnormalities of gait and mobility (R26.89);Muscle weakness (generalized) (M62.81);Other symptoms and signs involving cognitive  function                Time: 6606-3016 OT Time Calculation (min): 45 min Charges:  OT General Charges $OT Visit: 1 Visit OT Evaluation $OT Eval Moderate Complexity: 1 Mod OT Treatments $Self Care/Home Management : 8-22 mins    Darrol Jump OTR/L Acute Rehab Services [phone] 10/01/2018, 9:48 AM

## 2018-10-01 NOTE — NC FL2 (Signed)
  [place] LEVEL OF CARE SCREENING TOOL     IDENTIFICATION  Patient Name: [name] Birthdate: 1929/10/11 Sex: female Admission Date (Current Location): 09/30/2018  [facility] and [place] Number:  Herbalist and Address:  [facility], [place] [address], [place], [place] [address]      Provider Number: [id number]  Attending Physician Name and Address:  Roxan Hockey, MD  Relative Name and Phone Number:  Sharlet Salina    Current Level of Care: Hospital Recommended Level of Care: [place] Prior Approval Number:    Date Approved/Denied:   PASRR Number: [id number]  Discharge Plan: SNF    Current Diagnoses: Patient Active Problem List   Diagnosis Date Noted  . CAD (coronary artery disease) 10/01/2018  . Near syncope 09/30/2018  . Acute-on-chronic kidney injury ([place]) 09/30/2018  . Elevated troponin 09/30/2018  . Supratherapeutic INR 09/30/2018  . History of cerebrovascular accident (CVA) with residual deficit 09/30/2018  . Anemia 01/21/2016  . Left-sided chest wall pain 01/21/2016  . CKD (chronic kidney disease), stage III ([place]) 01/21/2016  . Hypothyroidism 01/21/2016  . Atrial fibrillation ([place])   . Chronic systolic CHF (congestive heart failure) ([facility])     Orientation RESPIRATION BLADDER Height & Weight     Self  Normal Continent Weight: 95 kg Height:  5\' 4"  (162.6 cm)  BEHAVIORAL SYMPTOMS/MOOD NEUROLOGICAL BOWEL NUTRITION STATUS      Continent Diet(Heart Healthy)  AMBULATORY STATUS COMMUNICATION OF NEEDS Skin   Extensive Assist Verbally Normal                       Personal Care Assistance Level of Assistance  Bathing, Feeding, Dressing Bathing Assistance: Maximum assistance Feeding assistance: Maximum assistance Dressing Assistance: Maximum assistance     Functional Limitations Info             SPECIAL CARE FACTORS FREQUENCY  PT (By licensed PT), OT (By licensed OT)     PT Frequency: 5 x  week OT Frequency: 5 x week            Contractures Contractures Info: Not present    Additional Factors Info  Code Status, Allergies, Psychotropic Code Status Info: Full Code Allergies Info: Avelox, Doxycycline           Current Medications (10/01/2018):  This is the current hospital active medication list Current Facility-Administered Medications  Medication Dose Route Frequency Provider Last Rate Last Dose  . acetaminophen (TYLENOL) tablet 650 mg  650 mg Oral Q6H PRN Lenore Cordia, MD       Or  . acetaminophen (TYLENOL) suppository 650 mg  650 mg Rectal Q6H PRN Zada Finders R, MD      . atorvastatin (LIPITOR) tablet 40 mg  40 mg Oral De Nurse, MD   40 mg at 10/01/18 0948  . levothyroxine (SYNTHROID, LEVOTHROID) tablet 125 mcg  125 mcg Oral Q0600 Lenore Cordia, MD   125 mcg at 10/01/18 8182  . sodium chloride flush (NS) 0.9 % injection 3 mL  3 mL Intravenous Q12H Lenore Cordia, MD   3 mL at 10/01/18 9937     Discharge Medications: Please see discharge summary for a list of discharge medications.  Relevant Imaging Results:  Relevant Lab Results:   Additional Information [id number]  Bethena Roys, RN

## 2018-10-01 NOTE — Discharge Summary (Signed)
[name], is a 85 y.o. female  DOB Mar 02, 1930  MRN [id number].  Admission date:  09/30/2018  Admitting Physician  Lenore Cordia, MD  Discharge Date:  10/01/2018   Primary MD  Maury Dus, MD  Recommendations for primary care physician for things to follow:   1)Very low-salt diet advised 2)Weigh yourself daily, call if you gain more than 3 pounds in 1 day or more than 5 pounds in 1 week as your diuretic medications may need to be adjusted 3)Limit your Fluid  intake to no more than 60 ounces (1.8 Liters) per day 4)Reduce Losartan to 25 mg daily from 100 mg daily, also reduce carvedilol/Coreg to 6.25 mg twice a day from 25 mg twice a day due to soft BP/blood pressure 5)Continue Lasix/furosemide and all other medications as advised 6)You are Taking Xarelxo/rivaroxaban-----so Avoid ibuprofen/Advil/Aleve/Motrin/Goody Powders/Naproxen/BC powders/Meloxicam/Diclofenac/Indomethacin and other Nonsteroidal anti-inflammatory medications as these will make you more likely to bleed and can cause stomach ulcers, can also cause Kidney problems.   Admission Diagnosis  Encephalopathy [G93.40] Elevated troponin [R79.89]   Discharge Diagnosis  Encephalopathy [G93.40] Elevated troponin [R79.89]    Principal Problem:   Near syncope Active Problems:   Atrial fibrillation (HCC)   Chronic systolic CHF (congestive heart failure) (HCC)   Hypothyroidism   Acute-on-chronic kidney injury (HCC)   Elevated troponin   Supratherapeutic INR   History of cerebrovascular accident (CVA) with residual deficit   CAD (coronary artery disease)      Past Medical History:  Diagnosis Date   Atrial fibrillation (HCC)    CAD (coronary artery disease) 06/2010 cath   Occluded distal RCA, moderately diffuse LCx and LAD disease out of proportion to LV dysfxn, and significant MR   CHF (congestive heart failure) (HCC)    EF 20-25% and  severe MR    CKD (chronic kidney disease)    Baseline Cr 1.1-1.2    CVA (cerebral infarction)    Left hemiparesis   Hyperlipidemia    Hypertension    Hypothyroidism    Torsades de pointes (HCC)    Moxifloxacin induced    Venous insufficiency    Chronic LE edema    Past Surgical History:  Procedure Laterality Date   CHOLECYSTECTOMY         HPI  from the history and physical done on the day of admission:    Chief Complaint: Near syncope  HPI: [name] is a 85 y.o. female with medical history significant for chronic systolic heart failure, CAD, atrial fibrillation on Xarelto, hypertension, hyperlipidemia, CKD stage III, hx of CVA w/ residual left-sided weakness, and hypothyroidism who presents to the ED reported near syncope, altered mental status, and speech difficulty.    Patient apparently had some confusion around 12 PM day of admission 09/30/2018.  She went to sleep in the afternoon and daughter saw her at home around 5 PM at which time she appeared at her baseline.  Her son came to the house and saw her around 8 PM and noted that  she was very confused and disoriented.  She apparently stood up and had a near syncopal episode without loss of consciousness or fall.  She was also noted to have trouble speaking at the time.  Due to these changes she was brought to the ED for further evaluation.  Patient currently denies any chest pain, palpitations, dyspnea, abdominal pain, dysuria, diarrhea, nausea, vomiting.    She says she is taking Xarelto for atrial fibrillation, previously on Coumadin but had difficulty with keeping in therapeutic range.  ED Course:  Vitals in the ED showed BP 117/91, pulse 102, RR 19, SPO2 99% on room air.  Labs are notable for troponin I 0.08, INR 4.8, serum glucose 100, BUN 25, creatinine 2.4 (most recent creatinine 1.23 January 2016), negative ethanol level.  CBC was unremarkable.  EKG showed atrial fibrillation with controlled rate.   Portable chest x-ray showed enlarged cardiac silhouette, interstitial congestion, and aortic atherosclerosis.  CT head without contrast showed mild chronic ischemic white matter disease and mild diffuse cortical atrophy without evidence of acute intracranial abnormality.  Neurology were consulted and recommended MRI brain to evaluate for questionable new dysarthria without further neurological work-up if MRI negative.  MRI brain was obtained and was negative for acute intracranial process, showed moderate chronic small vessel ischemic changes and old right basal ganglia infarct, exam was motion degraded.  The hospitalist service was consulted to admit for further evaluation and management of presyncope and elevated troponin.  Review of Systems: As per HPI otherwise 10 point review of systems negative.    Hospital Course:   Principal Problem:   Near syncope Active Problems:   Atrial fibrillation (HCC)   Chronic systolic CHF (congestive heart failure) (HCC)   Hypothyroidism   Acute-on-chronic kidney injury ([place])   Elevated troponin   Supratherapeutic INR   History of cerebrovascular accident (CVA) with residual deficit   CAD (coronary artery disease)  Brief Summary:- [name] is a 85 y.o. female with medical history significant for chronic systolic heart failure, CAD, atrial fibrillation on Xarelto, hypertension, hyperlipidemia, CKD stage III, hx of CVA w/ residual left-sided weakness, and hypothyroidism who is admitted after a suspected presyncopal event with questionable dysarthria and mental status change----patient appears back to her baseline at this time, soft BP noted   Plan:- Near syncope/AMS/speech difficulty: Suspect hypotensive episode secondary to dehydration.  MRI brain negative for acute CVA. Work-up negative for ACS/acute MI, work-up was negative for acute strokes  Acute on chronic stage III kidney injury: Suspect prerenal from dehydration, although no  recent labs within the last 2 years available. Admission creatinine was 2.4 creatinine peaked at 2.5 creatinine is down to 2.2 at this time, monitor BMP as outpatient, avoid nephrotoxic agents, losartan has been reduced to 25 mg daily, renal function should improve with improvement in BP (patient was somewhat hypotensive on admission)  Atrial fibrillation: Stable, TSH is 0.44, Chronic atrial fibrillation on Xarelto for anticoagulation. -Rate currently controlled, decrease carvedilol to 6.25 mg twice daily from 25 mg twice daily due to SOFT BP concerns, if rate control is required may switch to metoprolol XL which will be less :"harsh" on blood pressure while providing adequate rate control... Avoid Cardizem due to very low EF Continue Xarelto for secondary stroke prophylaxis  Elevated troponin and history of CAD: Troponin 0.08 on admission.  Cardiac cath January 2012 showed an occluded distal RCA and left to right collaterals, moderately diffuse disease involving the LAD and circumflex.  Patient denies any active chest pain----troponin  is flat, pattern not consistent with ACS, cardiologist advised against echocardiogram in order to rule out regional wall motion abnormalities   Chronic systolic CHF: Most recent EF 20-25% with significant mitral regurgitation based on echocardiogram documentation from January 2012.  Blood pressure remains soft, soft BP may have contributed to her near syncopal episode, reduce Coreg to 6.25 mg twice daily from 25 mg twice daily, reduce losartan to 25 mg daily from 100 mg daily due to soft BP- Daily weights and strict I/O's Cardiologist advised against repeat echocardiogram at this time-this probably would not change management at this time  Hypertension: Patient borderline hypotensive on admission. -Blood pressure remains soft, soft BP may have contributed to her near syncopal episode, reduce Coreg to 6.25 mg twice daily from 25 mg twice daily, reduce losartan to  25 mg daily from 100 mg daily due to soft BP  Hyperlipidemia: -Continue atorvastatin  Hypothyroidism: -Stable , TSH is 0.44, continue Synthroid  History of CVA with residual left-sided weakness: Imaging studies without evidence for new stroke, neurologically no new neurological deficits at this time but patient has generalized weakness.  MRI brain negative for acute infarct. -PT/OT eval appreciated, PT recommends SNF rehab   DVT prophylaxis: SCDs Code Status: DNR, discussed with patient Family Communication:  d/w daughter Ms Kandice Robinsons by Phone Disposition Plan: SNF Consults called:  Admitting MD/EDP discussed case with neurologist  Discharge Condition: stable  Follow UP  Contact information for after-discharge care    [facility] .   Service:  Skilled Nursing Contact information: [place] [phone]              Diet and Activity recommendation:  As advised  Discharge Instructions    Discharge Instructions    Call MD for:  difficulty breathing, headache or visual disturbances   Complete by:  As directed    Call MD for:  persistant nausea and vomiting   Complete by:  As directed    Call MD for:  severe uncontrolled pain   Complete by:  As directed    Call MD for:  temperature >100.4   Complete by:  As directed    Diet - low sodium heart healthy   Complete by:  As directed    Discharge instructions   Complete by:  As directed    1)Very low-salt diet advised 2)Weigh yourself daily, call if you gain more than 3 pounds in 1 day or more than 5 pounds in 1 week as your diuretic medications may need to be adjusted 3)Limit your Fluid  intake to no more than 60 ounces (1.8 Liters) per day 4)Reduce Losartan to 25 mg daily from 100 mg daily, also reduce carvedilol/Coreg to 6.25 mg twice a day from 25 mg twice a day due to soft BP/blood pressure 5)Continue Lasix/furosemide and all  other medications as advised 6)You are Taking Xarelxo/rivaroxaban-----so Avoid ibuprofen/Advil/Aleve/Motrin/Goody Powders/Naproxen/BC powders/Meloxicam/Diclofenac/Indomethacin and other Nonsteroidal anti-inflammatory medications as these will make you more likely to bleed and can cause stomach ulcers, can also cause Kidney problems.   Increase activity slowly   Complete by:  As directed         Discharge Medications     Allergies as of 10/01/2018      Reactions   Avelox [moxifloxacin Hcl In Nacl] Other (See Comments)   "didn't know what was going on, lost 3 days"   Doxycycline Hives, Itching, Rash      Medication List  TAKE these medications   acetaminophen 325 MG tablet Commonly known as:  TYLENOL Take 2 tablets (650 mg total) by mouth every 6 (six) hours as needed for mild pain, moderate pain or headache (or Fever >/= 101).   alendronate 70 MG tablet Commonly known as:  FOSAMAX Take 70 mg by mouth once a week. thursday   atorvastatin 40 MG tablet Commonly known as:  LIPITOR Take 40 mg by mouth every other day.   calcium carbonate 1500 (600 Ca) MG Tabs tablet Commonly known as:  OSCAL Take 1,500 mg by mouth 2 (two) times daily with a meal.   carvedilol 6.25 MG tablet Commonly known as:  COREG Take 1 tablet (6.25 mg total) by mouth 2 (two) times daily with a meal. What changed:    medication strength  how much to take   ferrous gluconate 324 MG tablet Commonly known as:  FERGON Take 1 tablet (324 mg total) by mouth daily with breakfast.   furosemide 40 MG tablet Commonly known as:  LASIX Take 40 mg by mouth daily.   levothyroxine 125 MCG tablet Commonly known as:  SYNTHROID, LEVOTHROID Take 125 mcg by mouth daily before breakfast.   losartan 25 MG tablet Commonly known as:  COZAAR Take 1 tablet (25 mg total) by mouth daily. What changed:    medication strength  how much to take   potassium chloride 10 MEQ tablet Commonly known as:   K-DUR,KLOR-CON Take 10 mEq by mouth daily.   senna-docusate 8.6-50 MG tablet Commonly known as:  Senokot-S Take 2 tablets by mouth at bedtime. What changed:    how much to take  when to take this  reasons to take this   solifenacin 10 MG tablet Commonly known as:  VESICARE Take 10 mg by mouth daily.   Xarelto 20 MG Tabs tablet Generic drug:  rivaroxaban Take 20 mg by mouth every evening.       Major procedures and Radiology Reports - PLEASE review detailed and final reports for all details, in brief -  Ct Head Wo Contrast  Result Date: 09/30/2018 CLINICAL DATA:  Altered level of consciousness. EXAM: CT HEAD WITHOUT CONTRAST TECHNIQUE: Contiguous axial images were obtained from the base of the skull through the vertex without intravenous contrast. COMPARISON:  None. FINDINGS: Brain: Mild chronic ischemic white matter disease is noted. Mild diffuse cortical atrophy is noted. No mass effect or midline shift is noted. Ventricular size is within normal limits. There is no evidence of mass lesion, hemorrhage or acute infarction. Vascular: No hyperdense vessel or unexpected calcification. Skull: Normal. Negative for fracture or focal lesion. Sinuses/Orbits: No acute finding. Other: None. IMPRESSION: Mild chronic ischemic white matter disease. Mild diffuse cortical atrophy. No acute intracranial abnormality seen. Electronically Signed   By: Marijo Conception, M.D.   On: 09/30/2018 21:18   Mr Brain Wo Contrast  Result Date: 09/30/2018 CLINICAL DATA:  Dysarthria. History of stroke and LEFT hemiparesis, atrial fibrillation, hypertension and hyperlipidemia. EXAM: MRI HEAD WITHOUT CONTRAST TECHNIQUE: Multiplanar, multiecho pulse sequences of the brain and surrounding structures were obtained without intravenous contrast. COMPARISON:  CT HEAD September 30, 2018 FINDINGS: Moderately motion degraded examination. INTRACRANIAL CONTENTS: No reduced diffusion to suggest acute ischemia. No susceptibility artifact  to suggest hemorrhage. Basal ganglia mineralization. No parenchymal brain volume loss for age. No hydrocephalus. Old small RIGHT basal ganglia infarct with mild ex vacuo dilatation subjacent ventricle. Patchy supratentorial white matter FLAIR T2 hyperintensities. No suspicious parenchymal signal, masses, mass effect. No abnormal  extra-axial fluid collections. No extra-axial masses. VASCULAR: Normal major intracranial vascular flow voids present at skull base. SKULL AND UPPER CERVICAL SPINE: No abnormal sellar expansion. No suspicious calvarial bone marrow signal. Craniocervical junction maintained. SINUSES/ORBITS: LEFT maxillary mucosal retention cyst. Mastoid air cells are well aerated.The included ocular globes and orbital contents are non-suspicious. Status post bilateral ocular lens implants. OTHER: None. IMPRESSION: 1. No acute intracranial process on this moderately motion degraded examination. 2. Moderate chronic small vessel ischemic changes and old RIGHT basal ganglia infarct. Electronically Signed   By: Elon Alas M.D.   On: 09/30/2018 22:54   Dg Chest Port 1 View  Result Date: 09/30/2018 CLINICAL DATA:  Initial evaluation for acute altered mental status, weakness. EXAM: PORTABLE CHEST 1 VIEW COMPARISON:  Prior radiograph from 01/21/2016. FINDINGS: Advanced cardiomegaly, stable. Mediastinal silhouette within normal limits. Aortic atherosclerosis. Lungs are hypoinflated with secondary bibasilar bronchovascular crowding. Scattered diffuse peribronchial thickening, likely reflecting mild interstitial congestion. No consolidative opacity. No pleural effusion. No pneumothorax. No acute osseous finding. Degenerative changes noted about the right shoulder. IMPRESSION: 1. Advanced cardiomegaly with mild diffuse interstitial congestion without overt pulmonary edema. 2. Aortic atherosclerosis. Electronically Signed   By: Jeannine Boga M.D.   On: 09/30/2018 21:57   Today   Subjective    Idaly Verret today has no new concerns... Eating and drinking well, patient appears back to baseline from a cognitive standpoint,          Patient has been seen and examined prior to discharge   Objective   Blood pressure 103/69, pulse (!) 117, temperature (!) 97.4 F (36.3 C), temperature source Oral, resp. rate (!) 24, height 5\' 4"  (1.626 m), weight 95 kg, SpO2 94 %.   Intake/Output Summary (Last 24 hours) at 10/01/2018 1304 Last data filed at 10/01/2018 0415 Gross per 24 hour  Intake 243 ml  Output 70 ml  Net 173 ml    Exam Gen:- Awake Alert, no acute distress  HEENT:- [place].AT, No sclera icterus Neck-Supple Neck,No JVD,.  Lungs-  CTAB , good air movement bilaterally  CV- S1, S2 normal, irregular Abd-  +ve B.Sounds, Abd Soft, No tenderness,    Extremity/Skin:- No  edema,   good pulses Psych-affect is appropriate, oriented x3 Neuro-no new focal deficits, patient with mild left-sided hemiparesis from prior stroke, no tremors    Data Review   CBC w Diff:  Lab Results  Component Value Date   WBC 7.7 10/01/2018   HGB 12.2 10/01/2018   HCT 38.2 10/01/2018   PLT 118 (L) 10/01/2018   LYMPHOPCT 20 09/30/2018   MONOPCT 9 09/30/2018   EOSPCT 2 09/30/2018   BASOPCT 0 09/30/2018    CMP:  Lab Results  Component Value Date   NA 136 10/01/2018   K 4.4 10/01/2018   CL 103 10/01/2018   CO2 20 (L) 10/01/2018   BUN 25 (H) 10/01/2018   CREATININE 2.27 (H) 10/01/2018   PROT 6.9 09/30/2018   ALBUMIN 3.3 (L) 09/30/2018   BILITOT 1.0 09/30/2018   ALKPHOS 102 09/30/2018   AST 15 09/30/2018   ALT 12 09/30/2018  .   Total Discharge time is about 33 minutes  Roxan Hockey M.D on 10/01/2018 at 1:04 PM  Go to [email or web address] -  for contact info  [facility] - Office  [phone]

## 2018-10-02 LAB — URINE CULTURE: Culture: >=100000 — AB

## 2018-12-14 ENCOUNTER — Encounter (HOSPITAL_COMMUNITY): Payer: Self-pay | Admitting: Emergency Medicine

## 2018-12-14 ENCOUNTER — Emergency Department (HOSPITAL_COMMUNITY): Payer: Medicare Other

## 2018-12-14 ENCOUNTER — Inpatient Hospital Stay (HOSPITAL_COMMUNITY)
Admission: EM | Admit: 2018-12-14 | Discharge: 2018-12-17 | DRG: 640 | Disposition: A | Discharge status: Hospice | Payer: Medicare Other | Source: Skilled Nursing Facility | Attending: Internal Medicine | Admitting: Internal Medicine

## 2018-12-14 DIAGNOSIS — Z515 Encounter for palliative care: Secondary | ICD-10-CM | POA: Diagnosis not present

## 2018-12-14 DIAGNOSIS — I5022 Chronic systolic (congestive) heart failure: Secondary | ICD-10-CM | POA: Diagnosis present

## 2018-12-14 DIAGNOSIS — N390 Urinary tract infection, site not specified: Secondary | ICD-10-CM | POA: Diagnosis present

## 2018-12-14 DIAGNOSIS — Z8673 Personal history of transient ischemic attack (TIA), and cerebral infarction without residual deficits: Secondary | ICD-10-CM | POA: Diagnosis not present

## 2018-12-14 DIAGNOSIS — G9341 Metabolic encephalopathy: Secondary | ICD-10-CM | POA: Diagnosis present

## 2018-12-14 DIAGNOSIS — E860 Dehydration: Secondary | ICD-10-CM | POA: Diagnosis present

## 2018-12-14 DIAGNOSIS — I482 Chronic atrial fibrillation, unspecified: Secondary | ICD-10-CM | POA: Diagnosis present

## 2018-12-14 DIAGNOSIS — R4182 Altered mental status, unspecified: Secondary | ICD-10-CM | POA: Diagnosis present

## 2018-12-14 DIAGNOSIS — E785 Hyperlipidemia, unspecified: Secondary | ICD-10-CM | POA: Diagnosis present

## 2018-12-14 DIAGNOSIS — F0390 Unspecified dementia without behavioral disturbance: Secondary | ICD-10-CM | POA: Diagnosis present

## 2018-12-14 DIAGNOSIS — Z79890 Hormone replacement therapy (postmenopausal): Secondary | ICD-10-CM

## 2018-12-14 DIAGNOSIS — I4811 Longstanding persistent atrial fibrillation: Secondary | ICD-10-CM | POA: Diagnosis not present

## 2018-12-14 DIAGNOSIS — Z7983 Long term (current) use of bisphosphonates: Secondary | ICD-10-CM

## 2018-12-14 DIAGNOSIS — N183 Chronic kidney disease, stage 3 unspecified: Secondary | ICD-10-CM | POA: Diagnosis present

## 2018-12-14 DIAGNOSIS — I4891 Unspecified atrial fibrillation: Secondary | ICD-10-CM | POA: Diagnosis present

## 2018-12-14 DIAGNOSIS — E876 Hypokalemia: Secondary | ICD-10-CM | POA: Diagnosis not present

## 2018-12-14 DIAGNOSIS — N179 Acute kidney failure, unspecified: Secondary | ICD-10-CM | POA: Diagnosis present

## 2018-12-14 DIAGNOSIS — M6250 Muscle wasting and atrophy, not elsewhere classified, unspecified site: Secondary | ICD-10-CM | POA: Diagnosis present

## 2018-12-14 DIAGNOSIS — R9431 Abnormal electrocardiogram [ECG] [EKG]: Secondary | ICD-10-CM | POA: Diagnosis present

## 2018-12-14 DIAGNOSIS — N184 Chronic kidney disease, stage 4 (severe): Secondary | ICD-10-CM | POA: Diagnosis present

## 2018-12-14 DIAGNOSIS — E039 Hypothyroidism, unspecified: Secondary | ICD-10-CM | POA: Diagnosis present

## 2018-12-14 DIAGNOSIS — R451 Restlessness and agitation: Secondary | ICD-10-CM

## 2018-12-14 DIAGNOSIS — R7989 Other specified abnormal findings of blood chemistry: Secondary | ICD-10-CM | POA: Diagnosis present

## 2018-12-14 DIAGNOSIS — E870 Hyperosmolality and hypernatremia: Secondary | ICD-10-CM | POA: Diagnosis not present

## 2018-12-14 DIAGNOSIS — Z66 Do not resuscitate: Secondary | ICD-10-CM

## 2018-12-14 DIAGNOSIS — R410 Disorientation, unspecified: Secondary | ICD-10-CM | POA: Diagnosis not present

## 2018-12-14 DIAGNOSIS — R531 Weakness: Secondary | ICD-10-CM | POA: Diagnosis present

## 2018-12-14 DIAGNOSIS — I130 Hypertensive heart and chronic kidney disease with heart failure and stage 1 through stage 4 chronic kidney disease, or unspecified chronic kidney disease: Secondary | ICD-10-CM | POA: Diagnosis present

## 2018-12-14 DIAGNOSIS — Z20828 Contact with and (suspected) exposure to other viral communicable diseases: Secondary | ICD-10-CM | POA: Diagnosis present

## 2018-12-14 DIAGNOSIS — R778 Other specified abnormalities of plasma proteins: Secondary | ICD-10-CM | POA: Diagnosis present

## 2018-12-14 DIAGNOSIS — Z7901 Long term (current) use of anticoagulants: Secondary | ICD-10-CM

## 2018-12-14 DIAGNOSIS — Z9981 Dependence on supplemental oxygen: Secondary | ICD-10-CM

## 2018-12-14 DIAGNOSIS — I2510 Atherosclerotic heart disease of native coronary artery without angina pectoris: Secondary | ICD-10-CM | POA: Diagnosis present

## 2018-12-14 DIAGNOSIS — Z87891 Personal history of nicotine dependence: Secondary | ICD-10-CM | POA: Diagnosis not present

## 2018-12-14 DIAGNOSIS — E871 Hypo-osmolality and hyponatremia: Secondary | ICD-10-CM | POA: Diagnosis present

## 2018-12-14 DIAGNOSIS — R0600 Dyspnea, unspecified: Secondary | ICD-10-CM

## 2018-12-14 DIAGNOSIS — R0609 Other forms of dyspnea: Secondary | ICD-10-CM | POA: Diagnosis not present

## 2018-12-14 LAB — BASIC METABOLIC PANEL
Anion gap: 15 (ref 5–15)
BUN: 38 mg/dL — ABNORMAL HIGH (ref 8–23)
CO2: 29 mmol/L (ref 22–32)
Calcium: 13.5 mg/dL (ref 8.9–10.3)
Chloride: 100 mmol/L (ref 98–111)
Creatinine, Ser: 2.78 mg/dL — ABNORMAL HIGH (ref 0.44–1.00)
GFR calc Af Amer: 17 mL/min — ABNORMAL LOW (ref >60)
GFR calc non Af Amer: 15 mL/min — ABNORMAL LOW (ref >60)
Glucose, Bld: 93 mg/dL (ref 70–99)
Potassium: 2.6 mmol/L — CL (ref 3.5–5.1)
Sodium: 144 mmol/L (ref 135–145)

## 2018-12-14 LAB — COMPREHENSIVE METABOLIC PANEL
ALT: 24 U/L (ref 0–44)
AST: 45 U/L — ABNORMAL HIGH (ref 15–41)
Albumin: 3.3 g/dL — ABNORMAL LOW (ref 3.5–5.0)
Alkaline Phosphatase: 70 U/L (ref 38–126)
Anion gap: 14 (ref 5–15)
BUN: 39 mg/dL — ABNORMAL HIGH (ref 8–23)
CO2: 28 mmol/L (ref 22–32)
Calcium: 13.8 mg/dL (ref 8.9–10.3)
Chloride: 101 mmol/L (ref 98–111)
Creatinine, Ser: 2.75 mg/dL — ABNORMAL HIGH (ref 0.44–1.00)
GFR calc Af Amer: 17 mL/min — ABNORMAL LOW (ref >60)
GFR calc non Af Amer: 15 mL/min — ABNORMAL LOW (ref >60)
Glucose, Bld: 96 mg/dL (ref 70–99)
Potassium: 2.5 mmol/L — CL (ref 3.5–5.1)
Sodium: 143 mmol/L (ref 135–145)
Total Bilirubin: 1.3 mg/dL — ABNORMAL HIGH (ref 0.3–1.2)
Total Protein: 6.6 g/dL (ref 6.5–8.1)

## 2018-12-14 LAB — CBC WITH DIFFERENTIAL/PLATELET
Abs Immature Granulocytes: 0.04 10*3/uL (ref 0.00–0.07)
Basophils Absolute: 0 10*3/uL (ref 0.0–0.1)
Basophils Relative: 0 %
Eosinophils Absolute: 0.1 10*3/uL (ref 0.0–0.5)
Eosinophils Relative: 2 %
HCT: 37.1 % (ref 36.0–46.0)
Hemoglobin: 12.6 g/dL (ref 12.0–15.0)
Immature Granulocytes: 1 %
Lymphocytes Relative: 19 %
Lymphs Abs: 1.3 10*3/uL (ref 0.7–4.0)
MCH: 30.8 pg (ref 26.0–34.0)
MCHC: 34 g/dL (ref 30.0–36.0)
MCV: 90.7 fL (ref 80.0–100.0)
Monocytes Absolute: 0.7 10*3/uL (ref 0.1–1.0)
Monocytes Relative: 9 %
Neutro Abs: 4.9 10*3/uL (ref 1.7–7.7)
Neutrophils Relative %: 69 %
Platelets: 164 10*3/uL (ref 150–400)
RBC: 4.09 MIL/uL (ref 3.87–5.11)
RDW: 14.1 % (ref 11.5–15.5)
WBC: 7.1 10*3/uL (ref 4.0–10.5)
nRBC: 0 % (ref 0.0–0.2)

## 2018-12-14 LAB — URINALYSIS, ROUTINE W REFLEX MICROSCOPIC
Bilirubin Urine: NEGATIVE
Glucose, UA: NEGATIVE mg/dL
Ketones, ur: NEGATIVE mg/dL
Nitrite: NEGATIVE
Protein, ur: 100 mg/dL — AB
Specific Gravity, Urine: 1.012 (ref 1.005–1.030)
WBC, UA: >50 WBC/hpf — ABNORMAL HIGH (ref 0–5)
pH: 7 (ref 5.0–8.0)

## 2018-12-14 LAB — PROCALCITONIN: Procalcitonin: <0.1 ng/mL

## 2018-12-14 LAB — MAGNESIUM: Magnesium: 1.1 mg/dL — ABNORMAL LOW (ref 1.7–2.4)

## 2018-12-14 LAB — LACTIC ACID, PLASMA
Lactic Acid, Venous: 1.5 mmol/L (ref 0.5–1.9)
Lactic Acid, Venous: 1.3 mmol/L (ref 0.5–1.9)

## 2018-12-14 LAB — MRSA PCR SCREENING: MRSA by PCR: POSITIVE — AB

## 2018-12-14 LAB — CBG MONITORING, ED: Glucose-Capillary: 90 mg/dL (ref 70–99)

## 2018-12-14 MED ORDER — LIDOCAINE 5 % EX PTCH
1.0000 | MEDICATED_PATCH | CUTANEOUS | Status: DC
  Administered 2018-12-14 – 2018-12-16 (×2): 1 via TRANSDERMAL
  Filled 2018-12-14 (×2): qty 1

## 2018-12-14 MED ORDER — LACTULOSE 10 GM/15ML PO SOLN: 10.0000 g | Freq: Every day | ORAL | Status: DC | PRN

## 2018-12-14 MED ORDER — POTASSIUM CHLORIDE 10 MEQ/100ML IV SOLN
10.0000 meq | INTRAVENOUS | Status: AC
  Administered 2018-12-14 (×2): 10 meq via INTRAVENOUS
  Filled 2018-12-14 (×2): qty 100

## 2018-12-14 MED ORDER — SODIUM CHLORIDE 0.9 % IV SOLN
INTRAVENOUS | Status: DC
  Administered 2018-12-14: 13:00:00 via INTRAVENOUS

## 2018-12-14 MED ORDER — SODIUM CHLORIDE 0.9 % IV SOLN
1.0000 g | INTRAVENOUS | Status: DC
  Administered 2018-12-14 – 2018-12-15 (×2): 1 g via INTRAVENOUS
  Filled 2018-12-14 (×3): qty 10

## 2018-12-14 MED ORDER — CALCITONIN (SALMON) 200 UNIT/ML IJ SOLN
4.0000 [IU]/kg | Freq: Two times a day (BID) | INTRAMUSCULAR | Status: DC
  Administered 2018-12-14 – 2018-12-15 (×3): 336 [IU] via SUBCUTANEOUS
  Filled 2018-12-14 (×5): qty 1.68

## 2018-12-14 MED ORDER — LORAZEPAM 2 MG/ML IJ SOLN
2.0000 mg | Freq: Once | INTRAMUSCULAR | Status: AC
  Administered 2018-12-14: 17:00:00 2 mg via INTRAMUSCULAR
  Filled 2018-12-14: qty 1

## 2018-12-14 MED ORDER — NYSTATIN 100000 UNIT/GM EX POWD
1.0000 | Freq: Two times a day (BID) | CUTANEOUS | Status: DC
  Administered 2018-12-14 – 2018-12-17 (×6): 1 via TOPICAL
  Filled 2018-12-14: qty 15

## 2018-12-14 MED ORDER — SODIUM CHLORIDE 0.9 % IV SOLN
INTRAVENOUS | Status: DC
  Administered 2018-12-14: 900 mL via INTRAVENOUS
  Administered 2018-12-14 – 2018-12-16 (×5): via INTRAVENOUS

## 2018-12-14 MED ORDER — CARVEDILOL 12.5 MG PO TABS: 12.5000 mg | ORAL_TABLET | Freq: Two times a day (BID) | ORAL | Status: DC

## 2018-12-14 MED ORDER — POTASSIUM CHLORIDE 10 MEQ/100ML IV SOLN
10.0000 meq | Freq: Once | INTRAVENOUS | Status: AC
  Administered 2018-12-14: 16:00:00 10 meq via INTRAVENOUS
  Filled 2018-12-14: qty 100

## 2018-12-14 MED ORDER — VITAMIN D 25 MCG (1000 UNIT) PO TABS
2000.0000 [IU] | ORAL_TABLET | Freq: Every day | ORAL | Status: DC
  Filled 2018-12-14: qty 2

## 2018-12-14 MED ORDER — CALCIUM CARBONATE 1500 (600 CA) MG PO TABS
600.0000 mg | ORAL_TABLET | Freq: Two times a day (BID) | ORAL | Status: DC
  Filled 2018-12-14 (×3): qty 1

## 2018-12-14 MED ORDER — POTASSIUM CHLORIDE CRYS ER 10 MEQ PO TBCR: 10.0000 meq | EXTENDED_RELEASE_TABLET | Freq: Every day | ORAL | Status: DC

## 2018-12-14 MED ORDER — POTASSIUM CHLORIDE 10 MEQ/100ML IV SOLN
10.0000 meq | Freq: Once | INTRAVENOUS | Status: AC
  Administered 2018-12-14: 17:00:00 10 meq via INTRAVENOUS
  Filled 2018-12-14: qty 100

## 2018-12-14 MED ORDER — POTASSIUM CHLORIDE 10 MEQ/100ML IV SOLN
10.0000 meq | Freq: Once | INTRAVENOUS | Status: AC
  Administered 2018-12-14: 13:00:00 10 meq via INTRAVENOUS
  Filled 2018-12-14: qty 100

## 2018-12-14 MED ORDER — CHLORHEXIDINE GLUCONATE CLOTH 2 % EX PADS
6.0000 | MEDICATED_PAD | Freq: Every day | CUTANEOUS | Status: DC
  Administered 2018-12-15 – 2018-12-17 (×3): 6 via TOPICAL

## 2018-12-14 MED ORDER — MUPIROCIN 2 % EX OINT
TOPICAL_OINTMENT | Freq: Two times a day (BID) | CUTANEOUS | Status: DC
  Administered 2018-12-14 – 2018-12-17 (×6): via NASAL
  Filled 2018-12-14: qty 22

## 2018-12-14 MED ORDER — HEPARIN (PORCINE) 25000 UT/250ML-% IV SOLN
1150.0000 [IU]/h | INTRAVENOUS | Status: DC
  Administered 2018-12-14: 1150 [IU]/h via INTRAVENOUS
  Filled 2018-12-14: qty 250

## 2018-12-14 MED ORDER — CALCIUM CARBONATE 1250 (500 CA) MG PO TABS: 1.0000 | ORAL_TABLET | Freq: Two times a day (BID) | ORAL | Status: DC

## 2018-12-14 MED ORDER — ATORVASTATIN CALCIUM 40 MG PO TABS: 40.0000 mg | ORAL_TABLET | ORAL | Status: DC

## 2018-12-14 MED ORDER — SENNOSIDES-DOCUSATE SODIUM 8.6-50 MG PO TABS
2.0000 | ORAL_TABLET | Freq: Every day | ORAL | Status: DC
  Filled 2018-12-14: qty 2

## 2018-12-14 MED ORDER — LEVOTHYROXINE SODIUM 25 MCG PO TABS: 125.0000 ug | ORAL_TABLET | Freq: Every day | ORAL | Status: DC

## 2018-12-14 NOTE — ED Provider Notes (Signed)
[facility] EMERGENCY DEPARTMENT Provider Note   CSN: [id number] Arrival date & time: 12/14/18  1031     History   Chief Complaint Chief Complaint  Patient presents with  . Abnormal Lab    HPI [name] is a 85 y.o. female.     85 year old female with history of A. fib on anticoagulants presents with altered mental status as well as low calcium.  Patient normally is not able to answer questions or follow commands but is reported more confused.  Unable to get further history at this time.     Past Medical History:  Diagnosis Date  . Atrial fibrillation ([place])   . CAD (coronary artery disease) 06/2010 cath   Occluded distal RCA, moderately diffuse LCx and LAD disease out of proportion to LV dysfxn, and significant MR  . CHF (congestive heart failure) (HCC)    EF 20-25% and severe MR   . CKD (chronic kidney disease)    Baseline Cr 1.1-1.2   . CVA (cerebral infarction)    Left hemiparesis  . Hyperlipidemia   . Hypertension   . Hypothyroidism   . 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 Venous insufficiency    Chronic LE edema    Patient Active Problem List   Diagnosis Date Noted  . CAD (coronary artery disease) 10/01/2018  . Near syncope 09/30/2018  . Acute-on-chronic kidney injury ([place]) 09/30/2018  . Elevated troponin 09/30/2018  . Supratherapeutic INR 09/30/2018  . History of cerebrovascular accident (CVA) with residual deficit 09/30/2018  . Anemia 01/21/2016  . Left-sided chest wall pain 01/21/2016  . CKD (chronic kidney disease), stage III ([place]) 01/21/2016  . Hypothyroidism 01/21/2016  . Atrial fibrillation ([place])   . Chronic systolic CHF (congestive heart failure) (HCC)     Past Surgical History:  Procedure Laterality Date  . CHOLECYSTECTOMY       OB History   No obstetric history on file.      Home Medications    Prior to Admission medications   Medication Sig Start Date End Date Taking? Authorizing  Provider  acetaminophen (TYLENOL) 325 MG tablet Take 2 tablets (650 mg total) by mouth every 6 (six) hours as needed for mild pain, moderate pain or headache (or Fever >/= 101). 10/01/18   Roxan Hockey, MD  alendronate (FOSAMAX) 70 MG tablet Take 70 mg by mouth once a week. thursday 01/07/16   [provider]  atorvastatin (LIPITOR) 40 MG tablet Take 40 mg by mouth every other day.     [provider]  calcium carbonate (OSCAL) 1500 (600 Ca) MG TABS tablet Take 1,500 mg by mouth 2 (two) times daily with a meal.    [provider]  carvedilol (COREG) 6.25 MG tablet Take 1 tablet (6.25 mg total) by mouth 2 (two) times daily with a meal. 10/01/18   Denton Brick, Courage, MD  ferrous gluconate (FERGON) 324 MG tablet Take 1 tablet (324 mg total) by mouth daily with breakfast. 01/24/16   Caren Griffins, MD  furosemide (LASIX) 40 MG tablet Take 40 mg by mouth daily.    [provider]  levothyroxine (SYNTHROID, LEVOTHROID) 125 MCG tablet Take 125 mcg by mouth daily before breakfast.    [provider]  losartan (COZAAR) 25 MG tablet Take 1 tablet (25 mg total) by mouth daily. 10/01/18   Roxan Hockey, MD  potassium chloride (K-DUR,KLOR-CON) 10 MEQ tablet Take 10 mEq by mouth daily.     [provider]  senna-docusate (SENOKOT-S) 8.6-50 MG tablet Take 2 tablets by mouth at bedtime. 10/01/18   Roxan Hockey, MD  solifenacin (VESICARE) 10 MG tablet Take 10 mg by mouth daily. 07/23/18   [provider]  XARELTO 20 MG TABS tablet Take 20 mg by mouth every evening. 07/30/18   [provider]    Family History Family History  Problem Relation Age of Onset  . Cancer Brother     Social History Social History   Tobacco Use  . Smoking status: Former Research scientist (life sciences)  . Smokeless tobacco: Never Used  . Tobacco comment: quit smoking 20-30 years ago  Substance Use Topics  . Alcohol use: No  . Drug use: No     Allergies   Avelox [moxifloxacin  hcl in nacl] and Doxycycline   Review of Systems Review of Systems  Unable to perform ROS: Acuity of condition     Physical Exam Updated Vital Signs BP (!) 132/94 (BP Location: Right Arm)   Pulse (!) 106   Temp 98.9 F (37.2 C) (Axillary)   Resp 16   SpO2 97%   Physical Exam Vitals signs and nursing note reviewed.  Constitutional:      General: She is not in acute distress.    Appearance: Normal appearance. She is well-developed. She is not toxic-appearing.  HENT:     Head: Normocephalic and atraumatic.  Eyes:     General: Lids are normal.     Conjunctiva/sclera: Conjunctivae normal.     Pupils: Pupils are equal, round, and reactive to light.  Neck:     Musculoskeletal: Normal range of motion and neck supple.     Thyroid: No thyroid mass.     Trachea: No tracheal deviation.  Cardiovascular:     Rate and Rhythm: Normal rate and regular rhythm.     Heart sounds: Normal heart sounds. No murmur. No gallop.   Pulmonary:     Effort: Pulmonary effort is normal. No respiratory distress.     Breath sounds: Normal breath sounds. No stridor. No decreased breath sounds, wheezing, rhonchi or rales.  Abdominal:     General: Bowel sounds are normal. There is no distension.     Palpations: Abdomen is soft.     Tenderness: There is no abdominal tenderness. There is no rebound.  Musculoskeletal: Normal range of motion.        General: No tenderness.  Skin:    General: Skin is warm and dry.     Findings: No abrasion or rash.  Neurological:     Mental Status: She is lethargic and disoriented.     GCS: GCS eye subscore is 4. GCS verbal subscore is 4. GCS motor subscore is 5.     Motor: No tremor.     Comments: Patient moves all 4 extremities.  He is oriented to person only.  Psychiatric:        Attention and Perception: She is inattentive.        Speech: Speech is tangential.        Behavior: Behavior is agitated.      ED Treatments / Results  Labs (all labs ordered are  listed, but only abnormal results are displayed) Labs Reviewed  URINE CULTURE  CBC WITH DIFFERENTIAL/PLATELET  COMPREHENSIVE METABOLIC PANEL  URINALYSIS, ROUTINE W REFLEX MICROSCOPIC  CBG MONITORING, ED    EKG EKG Interpretation  Date/Time:  Tuesday December 14 2018 11:01:22 EDT Ventricular Rate:  111 PR Interval:    QRS Duration: 103 QT Interval:  404 QTC Calculation: 549 R Axis:   37 Text Interpretation:  Atrial fibrillation Ventricular premature complex Abnormal R-wave progression, early transition Nonspecific repol abnormality, diffuse leads Minimal ST elevation, inferior leads Prolonged QT interval Confirmed by Lacretia Leigh ([id number]) on 12/14/2018 1:15:09 PM   Radiology No results found.  Procedures Procedures (including critical care time)  Medications Ordered in ED Medications  0.9 %  sodium chloride infusion (has no administration in time range)     Initial Impression / Assessment and Plan / ED Course  I have reviewed the triage vital signs and the nursing notes.  Pertinent labs & imaging results that were available during my care of the patient were reviewed by me and considered in my medical decision making (see chart for details).       Patient with evidence of hypercalcemia and hypokalemia.  To milliequivalents potassium IV piggyback ordered.  Patient has worsening kidney function as well.  Will be admitted to the hospitalist service Final Clinical Impressions(s) / ED Diagnoses   Final diagnoses:  None    ED Discharge Orders    None       Lacretia Leigh, MD 12/14/18 1316

## 2018-12-14 NOTE — Progress Notes (Signed)
Patient trasfered from ED to [phone] via stretcher; alert and disoriented x 4; no s/s of pain or other distress; IV in RFA running fluids @ KVO; skin intact with bruising on her legs and scratch marks on left thigh and left buttock; daughter at bedside - Orient to room and unit; gave patient care guide; instructed how to use the call bell and  fall risk precautions. Tele box 29 per MD order; Will continue to monitor the patient.

## 2018-12-14 NOTE — ED Notes (Signed)
2.5K+ 13.8Ca+

## 2018-12-14 NOTE — ED Notes (Signed)
RN attempted report x1.  

## 2018-12-14 NOTE — Progress Notes (Signed)
ANTICOAGULATION CONSULT NOTE - Follow Up Consult  Pharmacy Consult for Xarelto to Heparin Indication: atrial fibrillation  Allergies  Allergen Reactions  . Avelox [Moxifloxacin Hcl In Nacl] Other (See Comments)    "didn't know what was going on, lost 3 days"  . Doxycycline Hives, Itching and Rash    Patient Measurements: Weight: 184 lb 11.9 oz (83.8 kg)  Vital Signs: Temp: 99.1 F (37.3 C) (06/16 1108) Temp Source: Rectal (06/16 1108) BP: 111/60 (06/16 1549) Pulse Rate: 101 (06/16 1549)  Labs: Recent Labs    12/14/18 1118  HGB 12.6  HCT 37.1  PLT 164  CREATININE 2.75*    Estimated Creatinine Clearance: 15.1 mL/min (A) (by C-G formula based on SCr of 2.75 mg/dL (H)).  Assessment: 85 year old female on Xarelto prior to admission for Afib Admitted with AKI and confusion, to transition to heparin Last dose of Xarelto 6/15  Will follow PTT and heparin level until coorelating  Goal of Therapy:  aPTT 66 to 102  seconds Monitor platelets by anticoagulation protocol: Yes  Heparin level = 0.3 to 0.7   Plan:  Heparin drip at 1150 units / hr 8 hour PTT and heparin level Daily heparin level, CBC, PTT  Thank you Anette Guarneri, PharmD [phone]  12/14/2018,4:54 PM

## 2018-12-14 NOTE — ED Notes (Signed)
ED TO INPATIENT HANDOFF REPORT  ED Nurse Name and Phone #:  [phone]  S Name/Age/Gender [name] 85 y.o. female Room/Bed: 031C/031C  Code Status   Code Status: Prior   Home/SNF/Other Skilled nursing facility Patient oriented to: disoriented x4 Is this baseline? No   Triage Complete: Triage complete  Chief Complaint ams  Triage Note Pt was sent to ED due to abnormal Calcium level- gcems states pt is normally not able to answer questions or follow commands but today but seems more confused. On arrival pt has incomprehensible speech and is very agitated only wanting to lay on her left side.    Allergies Allergies  Allergen Reactions  . Avelox [Moxifloxacin Hcl In Nacl] Other (See Comments)    "didn't know what was going on, lost 3 days"  . Doxycycline Hives, Itching and Rash    Level of Care/Admitting Diagnosis ED Disposition    ED Disposition Condition [facility] Area: [facility] [[id number]]  Level of Care: Telemetry Medical [268]  Covid Evaluation: Screening Protocol (No Symptoms)  Diagnosis: Altered mental status, unspecified [[id number]]  Admitting Physician: Vashti Hey [[id number]]  Attending Physician: Vashti Hey [[id number]]  Estimated length of stay: past midnight tomorrow  Certification:: I certify this patient will need inpatient services for at least 2 midnights  PT Class (Do Not Modify): Inpatient [101]  PT Acc Code (Do Not Modify): Private [1]       B Medical/Surgery History Past Medical History:  Diagnosis Date  . Atrial fibrillation ([place])   . CAD (coronary artery disease) 06/2010 cath   Occluded distal RCA, moderately diffuse LCx and LAD disease out of proportion to LV dysfxn, and significant MR  . CHF (congestive heart failure) ([facility])    EF 20-25% and severe MR   . CKD (chronic kidney disease)    Baseline Cr 1.1-1.2   . CVA (cerebral infarction)    Left hemiparesis  . Hyperlipidemia   .  Hypertension   . Hypothyroidism   . 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 Venous insufficiency    Chronic LE edema   Past Surgical History:  Procedure Laterality Date  . CHOLECYSTECTOMY       A IV Location/Drains/Wounds Patient Lines/Drains/Airways Status   Active Line/Drains/Airways    Name:   Placement date:   Placement time:   Site:   Days:   Peripheral IV 01/22/16 Left;Anterior Forearm   01/22/16    0142    Forearm   1057   Peripheral IV 12/14/18 Anterior;Right Forearm   12/14/18    1033    Forearm   less than 1   Pressure Injury 10/01/18 Stage I -  Intact skin with non-blanchable redness of a localized area usually over a bony prominence. healing wound from previous injury   10/01/18    0050     74          Intake/Output Last 24 hours  Intake/Output Summary (Last 24 hours) at 12/14/2018 1502 Last data filed at 12/14/2018 1407 Gross per 24 hour  Intake 100 ml  Output -  Net 100 ml    Labs/Imaging Results for orders placed or performed during the hospital encounter of 12/14/18 (from the past 48 hour(s))  CBG monitoring, ED     Status: None   Collection Time: 12/14/18 10:43 AM  Result Value Ref Range   Glucose-Capillary 90 70 - 99 mg/dL  CBC with Differential/Platelet  Status: None   Collection Time: 12/14/18 11:18 AM  Result Value Ref Range   WBC 7.1 4.0 - 10.5 K/uL   RBC 4.09 3.87 - 5.11 MIL/uL   Hemoglobin 12.6 12.0 - 15.0 g/dL   HCT 37.1 36.0 - 46.0 %   MCV 90.7 80.0 - 100.0 fL   MCH 30.8 26.0 - 34.0 pg   MCHC 34.0 30.0 - 36.0 g/dL   RDW 14.1 11.5 - 15.5 %   Platelets 164 150 - 400 K/uL   nRBC 0.0 0.0 - 0.2 %   Neutrophils Relative % 69 %   Neutro Abs 4.9 1.7 - 7.7 K/uL   Lymphocytes Relative 19 %   Lymphs Abs 1.3 0.7 - 4.0 K/uL   Monocytes Relative 9 %   Monocytes Absolute 0.7 0.1 - 1.0 K/uL   Eosinophils Relative 2 %   Eosinophils Absolute 0.1 0.0 - 0.5 K/uL   Basophils Relative 0 %   Basophils Absolute 0.0 0.0 - 0.1 K/uL    Immature Granulocytes 1 %   Abs Immature Granulocytes 0.04 0.00 - 0.07 K/uL    Comment: Performed at [facility], [address], [place], [place] [address]  Comprehensive metabolic panel     Status: Abnormal   Collection Time: 12/14/18 11:18 AM  Result Value Ref Range   Sodium 143 135 - 145 mmol/L   Potassium 2.5 (LL) 3.5 - 5.1 mmol/L    Comment: CRITICAL RESULT CALLED TO, READ BACK BY AND VERIFIED WITH: B Leomia Blake RN [phone] [id number] BY A BENNETT    Chloride 101 98 - 111 mmol/L   CO2 28 22 - 32 mmol/L   Glucose, Bld 96 70 - 99 mg/dL   BUN 39 (H) 8 - 23 mg/dL   Creatinine, Ser 2.75 (H) 0.44 - 1.00 mg/dL   Calcium 13.8 (HH) 8.9 - 10.3 mg/dL    Comment: CRITICAL RESULT CALLED TO, READ BACK BY AND VERIFIED WITH: B Anniece Bleiler RN [id number] BY A BENNETT    Total Protein 6.6 6.5 - 8.1 g/dL   Albumin 3.3 (L) 3.5 - 5.0 g/dL   AST 45 (H) 15 - 41 U/L   ALT 24 0 - 44 U/L   Alkaline Phosphatase 70 38 - 126 U/L   Total Bilirubin 1.3 (H) 0.3 - 1.2 mg/dL   GFR calc non Af Amer 15 (L) >60 mL/min   GFR calc Af Amer 17 (L) >60 mL/min   Anion gap 14 5 - 15    Comment: Performed at [facility], [place] [address], [place], [place] [address]   Ct Head Wo Contrast  Result Date: 12/14/2018 CLINICAL DATA:  Altered level of consciousness.  Combative. EXAM: CT HEAD WITHOUT CONTRAST TECHNIQUE: Contiguous axial images were obtained from the base of the skull through the vertex without intravenous contrast. COMPARISON:  MRI and CT 09/30/2018 FINDINGS: Brain: Generalized atrophy. Considerable motion degradation. Chronic small-vessel changes of the white matter. No sign of acute infarction, mass lesion, hemorrhage, hydrocephalus or extra-axial collection. Vascular: No abnormal vascular finding. Skull: Negative as seen. Sinuses/Orbits: Negative as seen. Other: None IMPRESSION: Pronounced motion degradation. Generalized atrophy. Chronic small-vessel ischemic changes of the white matter. No acute finding  appreciable. Electronically Signed   By: Nelson Chimes M.D.   On: 12/14/2018 12:36   Dg Chest Port 1 View  Result Date: 12/14/2018 CLINICAL DATA:  Shortness of breath.  History of recent pneumonia. EXAM: PORTABLE CHEST 1 VIEW COMPARISON:  09/30/2018 FINDINGS: Heart size is enlarged. Aortic  calcifications are noted. Bibasilar atelectasis is noted. There may be trace bilateral pleural effusions. No pneumothorax. There is suggestion of mild volume overload. There is a linear metallic density projecting over the right upper lobe, favored to be external to the patient. IMPRESSION: Cardiomegaly with mild volume overload. Electronically Signed   By: Constance Holster M.D.   On: 12/14/2018 14:28    Pending Labs Unresulted Labs (From admission, onward)    Start     Ordered   12/14/18 1315  Novel Coronavirus,NAA,(SEND-OUT TO REF LAB - TAT 24-48 hrs); Hosp Order  (Asymptomatic Patients Labs)  Once,   STAT    Question:  Rule Out  Answer:  Yes   12/14/18 1314   12/14/18 1043  Urinalysis, Routine w reflex microscopic  Once,   STAT     12/14/18 1042   12/14/18 1043  Urine Culture  ONCE - STAT,   STAT     12/14/18 1042          Vitals/Pain Today's Vitals   12/14/18 1108 12/14/18 1247 12/14/18 1405 12/14/18 1415  BP:  130/83 (!) 121/93 (!) 155/112  Pulse:  (!) 105  98  Resp:  (!) 21 (!) 23 16  Temp: 99.1 F (37.3 C)     TempSrc: Rectal     SpO2:  100% 96% 98%    Isolation Precautions No active isolations  Medications Medications  0.9 %  sodium chloride infusion ( Intravenous New Bag/Given 12/14/18 1242)  potassium chloride 10 mEq in 100 mL IVPB ( Intravenous Stopped 12/14/18 1356)    Mobility non-ambulatory High fall risk   Focused Assessments Cardiac Assessment Handoff:    Lab Results  Component Value Date   CKTOTAL 279 (H) 12/13/2011   CKMB 4.4 (H) 07/01/2010   TROPONINI 0.08 ([place]) 10/01/2018   Lab Results  Component Value Date   DDIMER <0.27 01/21/2016   Does the Patient  currently have chest pain? No     R Recommendations: See Admitting Provider Note  Report given to:   Additional Notes:

## 2018-12-14 NOTE — ED Notes (Signed)
RN informed by 5w that it's ok for Family member to come, if accepted.

## 2018-12-14 NOTE — ED Triage Notes (Signed)
Pt was sent to ED due to abnormal Calcium level- gcems states pt is normally not able to answer questions or follow commands but today but seems more confused. On arrival pt has incomprehensible speech and is very agitated only wanting to lay on her left side.

## 2018-12-14 NOTE — ED Notes (Signed)
Called patient's daughter Jenny Reichmann, updated her that patient had arrived to ED confused. The daughter states that this has been going on for several days. Updated her that we are rechecking labs. Let daughter know she is able to visit today due to patients increased confusion. Will update family as more information becomes available.

## 2018-12-14 NOTE — Progress Notes (Signed)
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 Jamse Arn  Orders Received/Actions taken:

## 2018-12-14 NOTE — ED Notes (Signed)
Pt. Placed on purewick.  

## 2018-12-14 NOTE — Progress Notes (Signed)
Patient's daughter. Sharlet Salina, is at bedside; she was updated with patient's condition; she will be the person to be contacted with further information about the patient.

## 2018-12-14 NOTE — H&P (Signed)
History and Physical:    [name]   QMV:[id number] DOB: Sep 24, 1929 DOA: 12/14/2018  Referring MD/provider: Dr Zenia Resides PCP: Maury Dus, MD   Patient coming from: Home  Chief Complaint: Altered mental status.  History is primarily per patient's daughter and discussion with [facility].  History of Present Illness:   [name] is an 85 y.o. female with past medical history significant for CAD, atrial fibrillation on Xarelto, hypertension, HFrEF, CKD stage III and history of CVA and dementia who was noted to have some mild alteration in mental status by nursing staff as well as her daughter about 4 to 5 days ago.  Patient was initially treated conservatively however her delirium and confusion has worsened significantly over the past couple of days.  Work-up in the nursing home apparently revealed a UTI and community-acquired pneumonia per daughter's report.  Per review of medications patient was given a dose of ceftriaxone yesterday.  Recent labs however showed elevated calcium and hypokalemia and patient was referred to ED for evaluation and admission.  ED Course:  The patient was noted to have hypokalemia and hypercalcemia and was called in for admission.  ROS:   ROS   Review of Systems: Patient unable to provide given advanced dementia  Past Medical History:   Past Medical History:  Diagnosis Date  . Atrial fibrillation ([address])   . CAD (coronary artery disease) 06/2010 cath   Occluded distal RCA, moderately diffuse LCx and LAD disease out of proportion to LV dysfxn, and significant MR  . CHF (congestive heart failure) (HCC)    EF 20-25% and severe MR   . CKD (chronic kidney disease)    Baseline Cr 1.1-1.2   . CVA (cerebral infarction)    Left hemiparesis  . Hyperlipidemia   . Hypertension   . Hypothyroidism   . 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 Venous insufficiency    Chronic LE edema    Past Surgical History:   Past  Surgical History:  Procedure Laterality Date  . CHOLECYSTECTOMY      Social History:   Social History   Socioeconomic History  . Marital status: Single    Spouse name: Not on file  . Number of children: Not on file  . Years of education: Not on file  . Highest education level: Not on file  Occupational History  . Not on file  Social Needs  . Financial resource strain: Not on file  . Food insecurity    Worry: Not on file    Inability: Not on file  . Transportation needs    Medical: Not on file    Non-medical: Not on file  Tobacco Use  . Smoking status: Former Research scientist (life sciences)  . Smokeless tobacco: Never Used  . Tobacco comment: quit smoking 20-30 years ago  Substance and Sexual Activity  . Alcohol use: No  . Drug use: No  . Sexual activity: Not on file  Lifestyle  . Physical activity    Days per week: Not on file    Minutes per session: Not on file  . Stress: Not on file  Relationships  . Social Herbalist on phone: Not on file    Gets together: Not on file    Attends religious service: Not on file    Active member of club or organization: Not on file    Attends meetings of clubs or organizations: Not on file    Relationship status:  Not on file  . Intimate partner violence    Fear of current or ex partner: Not on file    Emotionally abused: Not on file    Physically abused: Not on file    Forced sexual activity: Not on file  Other Topics Concern  . Not on file  Social History Narrative   She lives at home alone and has limited mobility with a walker, but still does get around a little. Her daughter works right next to her, so she is checked on several times a day. She quit smoking 20-30 yr ago at the time of the stroke.     Allergies   Avelox [moxifloxacin hcl in nacl] and Doxycycline  Family history:   Family History  Problem Relation Age of Onset  . Cancer Brother     Current Medications:   Prior to Admission medications   Medication Sig Start  Date End Date Taking? Authorizing Provider  acetaminophen (TYLENOL) 325 MG tablet Take 2 tablets (650 mg total) by mouth every 6 (six) hours as needed for mild pain, moderate pain or headache (or Fever >/= 101). 10/01/18  Yes Emokpae, Courage, MD  alendronate (FOSAMAX) 70 MG tablet Take 70 mg by mouth once a week. thursday 01/07/16  Yes [provider]  atorvastatin (LIPITOR) 40 MG tablet Take 40 mg by mouth every other day.    Yes [provider]  calcium carbonate (OSCAL) 1500 (600 Ca) MG TABS tablet Take 600 mg of elemental calcium by mouth 2 (two) times daily with a meal.    Yes [provider]  carvedilol (COREG) 6.25 MG tablet Take 1 tablet (6.25 mg total) by mouth 2 (two) times daily with a meal. Patient taking differently: Take 12.5 mg by mouth 2 (two) times daily with a meal.  10/01/18  Yes Emokpae, Courage, MD  cefTRIAXone 1 g/4 mLs in lidocaine (with preservative) injection Inject 1 g into the muscle once. PNA \ UTI   Yes [provider]  cholecalciferol (VITAMIN D) 25 MCG (1000 UT) tablet Take 2,000 Units by mouth daily.   Yes [provider]  ferrous gluconate (FERGON) 324 MG tablet Take 1 tablet (324 mg total) by mouth daily with breakfast. 01/24/16  Yes Gherghe, Vella Redhead, MD  furosemide (LASIX) 40 MG tablet Take 40 mg by mouth daily.   Yes [provider]  lactulose (CHRONULAC) 10 GM/15ML solution Take 10 g by mouth daily as needed for mild constipation.   Yes [provider]  levothyroxine (SYNTHROID, LEVOTHROID) 125 MCG tablet Take 125 mcg by mouth daily before breakfast.   Yes [provider]  lidocaine (LIDODERM) 5 % Place 1 patch onto the skin daily. Remove & Discard patch within 12 hours or as directed by MD   Yes [provider]  LORazepam (ATIVAN) 2 MG/ML injection Inject 2 mg into the muscle once. For severe Agitation   Yes [provider]  nystatin (NYSTATIN) powder Apply 1 Bottle topically 2  (two) times daily. 1 Billion units Apply under Breasts   Yes [provider]  OVER THE COUNTER MEDICATION Take 17 g by mouth daily. Gavilax power   Yes [provider]  OXYGEN Inhale 2 L into the lungs continuous. Nasal Cannula   Yes [provider]  potassium chloride (K-DUR,KLOR-CON) 10 MEQ tablet Take 10 mEq by mouth daily.    Yes [provider]  senna-docusate (SENOKOT-S) 8.6-50 MG tablet Take 2 tablets by mouth at bedtime. 10/01/18  Yes Emokpae,  Courage, MD  solifenacin (VESICARE) 10 MG tablet Take 10 mg by mouth daily. 07/23/18  Yes [provider]  XARELTO 20 MG TABS tablet Take 20 mg by mouth every evening. 07/30/18  Yes [provider]  losartan (COZAAR) 25 MG tablet Take 1 tablet (25 mg total) by mouth daily. Patient not taking: Reported on 12/14/2018 10/01/18   Roxan Hockey, MD    Physical Exam:   Vitals:   12/14/18 1247 12/14/18 1405 12/14/18 1415 12/14/18 1549  BP: 130/83 (!) 121/93 (!) 155/112 111/60  Pulse: (!) 105  98 (!) 101  Resp: (!) 21 (!) 23 16 20   Temp:      TempSrc:      SpO2: 100% 96% 98% 92%  Weight:    83.8 kg     Physical Exam: Blood pressure 111/60, pulse (!) 101, temperature 99.1 F (37.3 C), temperature source Rectal, resp. rate 20, weight 83.8 kg, SpO2 92 %. Gen: Chronically ill appearing patient with extremely dry mucous membranes lying in bed moderately agitated with daughter at bedside. HEENT: Very dry mucous membranes with poor oral hygiene. Chest: Difficult to hear as patient was talking throughout.   CV: Distant, irregular.  Difficult to auscultate given patient's vocalizations Abdomen: NABS, soft, possible tenderness to deep palpation however it is difficult to tell whether this was tenderness versus just surprised  It was not reproducible upon distraction so most likely just surprised. Extremities: 1-2+ edema on left greater than 1+ edema on right.  Patient's daughter states this is chronic.   Chronic varicosities. Neuro: Delirious and agitated. Psych: Patient is cooperative, logical and coherent with appropriate mood and affect.  Data Review:    Labs: Basic Metabolic Panel: Recent Labs  Lab 12/14/18 1118 12/14/18 1505 12/14/18 1701  NA 143  --  144  K 2.5*  --  2.6*  CL 101  --  100  CO2 28  --  29  GLUCOSE 96  --  93  BUN 39*  --  38*  CREATININE 2.75*  --  2.78*  CALCIUM 13.8*  --  13.5*  MG  --  1.1*  --    Liver Function Tests: Recent Labs  Lab 12/14/18 1118  AST 45*  ALT 24  ALKPHOS 70  BILITOT 1.3*  PROT 6.6  ALBUMIN 3.3*   No results for input(s): LIPASE, AMYLASE in the last 168 hours. No results for input(s): AMMONIA in the last 168 hours. CBC: Recent Labs  Lab 12/14/18 1118  WBC 7.1  NEUTROABS 4.9  HGB 12.6  HCT 37.1  MCV 90.7  PLT 164   Cardiac Enzymes: No results for input(s): CKTOTAL, CKMB, CKMBINDEX, TROPONINI in the last 168 hours.  BNP (last 3 results) No results for input(s): PROBNP in the last 8760 hours. CBG: Recent Labs  Lab 12/14/18 1043  GLUCAP 90    Urinalysis    Component Value Date/Time   COLORURINE AMBER (A) 12/14/2018 1720   APPEARANCEUR TURBID (A) 12/14/2018 1720   LABSPEC 1.012 12/14/2018 1720   PHURINE 7.0 12/14/2018 1720   GLUCOSEU NEGATIVE 12/14/2018 1720   HGBUR MODERATE (A) 12/14/2018 1720   BILIRUBINUR NEGATIVE 12/14/2018 1720   KETONESUR NEGATIVE 12/14/2018 1720   PROTEINUR 100 (A) 12/14/2018 1720   UROBILINOGEN 0.2 12/12/2011 2221   NITRITE NEGATIVE 12/14/2018 1720   LEUKOCYTESUR LARGE (A) 12/14/2018 1720      Radiographic Studies: Ct Head Wo Contrast  Result Date: 12/14/2018 CLINICAL DATA:  Altered level of consciousness.  Combative. EXAM: CT HEAD  WITHOUT CONTRAST TECHNIQUE: Contiguous axial images were obtained from the base of the skull through the vertex without intravenous contrast. COMPARISON:  MRI and CT 09/30/2018 FINDINGS: Brain: Generalized atrophy. Considerable motion  degradation. Chronic small-vessel changes of the white matter. No sign of acute infarction, mass lesion, hemorrhage, hydrocephalus or extra-axial collection. Vascular: No abnormal vascular finding. Skull: Negative as seen. Sinuses/Orbits: Negative as seen. Other: None IMPRESSION: Pronounced motion degradation. Generalized atrophy. Chronic small-vessel ischemic changes of the white matter. No acute finding appreciable. Electronically Signed   By: Nelson Chimes M.D.   On: 12/14/2018 12:36   Dg Chest Port 1 View  Result Date: 12/14/2018 CLINICAL DATA:  Shortness of breath.  History of recent pneumonia. EXAM: PORTABLE CHEST 1 VIEW COMPARISON:  09/30/2018 FINDINGS: Heart size is enlarged. Aortic calcifications are noted. Bibasilar atelectasis is noted. There may be trace bilateral pleural effusions. No pneumothorax. There is suggestion of mild volume overload. There is a linear metallic density projecting over the right upper lobe, favored to be external to the patient. IMPRESSION: Cardiomegaly with mild volume overload. Electronically Signed   By: Constance Holster M.D.   On: 12/14/2018 14:28    EKG: Independently reviewed.  Atrial fibrillation at 110.  Poor baseline.  Nonspecific ST-T wave changes.   Assessment/Plan:   Principal Problem:   Altered mental status, unspecified Active Problems:   Atrial fibrillation (HCC)   Chronic systolic CHF (congestive heart failure) (HCC)   CKD (chronic kidney disease), stage III (HCC)   Elevated troponin   85 year old nursing home patient with systolic heart failure and EF of 20% and dementia presents with altered mental status/delirium, hypercalcemia and a verbal report of UTI and community-acquired pneumonia.  Urine here is equivocal.  No CAP noted on chest x-ray.  HYPERCALCEMIA Patient with hypercalcemia possibly secondary to oral supplementation of calcium/vitamin D and what appears to be significant dehydration.  No history of malignancy as far as we  know. This will be somewhat challenging to treat given patient's known systolic dysfunction with an EF of 20% as of 2012.  I cannot find a more recent echocardiogram. Will start patient on normal saline at 150 cc an hour. We will also give patient calcitonin x1. Lung check tonight with IV Lasix as warranted.  Patient is on 40 of Lasix orally at home which I have held so we can give her IV Lasix as needed. Ionized calcium requested for the morning.  HFr EF Patient with apparently EF of 20% on echocardiogram 2012At present does not appear to be in acute decompensated heart failure Patient with no oxygen requirement, satting 94% on room air. Will follow patient's lung exam closely overnight and give as needed Lasix as needed. Noted above I requested a repeat echocardiogram  UTI Patient's daughter patient had a UTI in the nursing home and was given ceftriaxone x1 today. UA here is equivocal, however I will continue the ceftriaxone as she probably has a partially treated UTI and this may be contributing to her altered mental status.  ATRIAL FIBRILLATION Patient has rate controlled atrial fibrillation. She does not appear to be on a rate control medication. She does come in on Xarelto however has creatinine of 2.75 which makes Xarelto contraindicated I have placed patient on IV heparin pending further discussion with family about goals for care and whether patient needs to continue on anticoagulation even her extremely poor functional status.  ELEVATED TROPONIN Patient has an elevated troponin at 0.08 although this is stable from last admission most likely  secondary to her chronic renal failure.  No acute ST-T wave changes noted on EKG.   Will follow every 6 hours x 3 to ensure stability.  HTN Hold losartan for now she may need to have diuresis This can be restarted in the morning as warranted      Other information:   DVT prophylaxis: Full dose heparin Code Status: DNR Family  Communication: Spoke with patient's daughter Mrs. Kandice Robinsons Disposition Plan: [facility] Consults called: None Admission status: Inpatient  The medical decision making on this patient was of high complexity and the patient is at high risk for clinical deterioration, therefore this is a level 3 visit.   Dewaine Oats Tublu  [facility]  If 7PM-7AM, please contact night-coverage [email or web address] Password [facility] 12/14/2018, 7:38 PM

## 2018-12-15 DIAGNOSIS — I482 Chronic atrial fibrillation, unspecified: Secondary | ICD-10-CM

## 2018-12-15 DIAGNOSIS — N183 Chronic kidney disease, stage 3 (moderate): Secondary | ICD-10-CM

## 2018-12-15 DIAGNOSIS — R4182 Altered mental status, unspecified: Secondary | ICD-10-CM

## 2018-12-15 DIAGNOSIS — G9341 Metabolic encephalopathy: Secondary | ICD-10-CM

## 2018-12-15 DIAGNOSIS — N184 Chronic kidney disease, stage 4 (severe): Secondary | ICD-10-CM

## 2018-12-15 DIAGNOSIS — N390 Urinary tract infection, site not specified: Secondary | ICD-10-CM

## 2018-12-15 DIAGNOSIS — Z66 Do not resuscitate: Secondary | ICD-10-CM

## 2018-12-15 LAB — URINE CULTURE: Culture: NO GROWTH

## 2018-12-15 LAB — COMPREHENSIVE METABOLIC PANEL
ALT: 27 U/L (ref 0–44)
AST: 55 U/L — ABNORMAL HIGH (ref 15–41)
Albumin: 3.1 g/dL — ABNORMAL LOW (ref 3.5–5.0)
Alkaline Phosphatase: 70 U/L (ref 38–126)
Anion gap: 13 (ref 5–15)
BUN: 37 mg/dL — ABNORMAL HIGH (ref 8–23)
CO2: 23 mmol/L (ref 22–32)
Calcium: 11.8 mg/dL — ABNORMAL HIGH (ref 8.9–10.3)
Chloride: 109 mmol/L (ref 98–111)
Creatinine, Ser: 2.5 mg/dL — ABNORMAL HIGH (ref 0.44–1.00)
GFR calc Af Amer: 19 mL/min — ABNORMAL LOW (ref >60)
GFR calc non Af Amer: 17 mL/min — ABNORMAL LOW (ref >60)
Glucose, Bld: 121 mg/dL — ABNORMAL HIGH (ref 70–99)
Potassium: 3.6 mmol/L (ref 3.5–5.1)
Sodium: 145 mmol/L (ref 135–145)
Total Bilirubin: 1.5 mg/dL — ABNORMAL HIGH (ref 0.3–1.2)
Total Protein: 6.2 g/dL — ABNORMAL LOW (ref 6.5–8.1)

## 2018-12-15 LAB — NOVEL CORONAVIRUS, NAA (HOSP ORDER, SEND-OUT TO REF LAB; TAT 18-24 HRS): SARS-CoV-2, NAA: NOT DETECTED

## 2018-12-15 LAB — APTT
aPTT: 182 seconds (ref 24–36)
aPTT: >200 seconds (ref 24–36)

## 2018-12-15 LAB — CBC
HCT: 36 % (ref 36.0–46.0)
Hemoglobin: 12.1 g/dL (ref 12.0–15.0)
MCH: 31 pg (ref 26.0–34.0)
MCHC: 33.6 g/dL (ref 30.0–36.0)
MCV: 92.3 fL (ref 80.0–100.0)
Platelets: 156 10*3/uL (ref 150–400)
RBC: 3.9 MIL/uL (ref 3.87–5.11)
RDW: 14.6 % (ref 11.5–15.5)
WBC: 8.8 10*3/uL (ref 4.0–10.5)
nRBC: 0 % (ref 0.0–0.2)

## 2018-12-15 LAB — PROCALCITONIN: Procalcitonin: <0.1 ng/mL

## 2018-12-15 LAB — HEPARIN LEVEL (UNFRACTIONATED): Heparin Unfractionated: >2.2 IU/mL — ABNORMAL HIGH (ref 0.30–0.70)

## 2018-12-15 MED ORDER — HEPARIN (PORCINE) 25000 UT/250ML-% IV SOLN
950.0000 [IU]/h | INTRAVENOUS | Status: DC
  Filled 2018-12-15: qty 250

## 2018-12-15 MED ORDER — LEVOTHYROXINE SODIUM 100 MCG/5ML IV SOLN
75.0000 ug | Freq: Every day | INTRAVENOUS | Status: DC
  Administered 2018-12-16: 07:00:00 75 ug via INTRAVENOUS
  Filled 2018-12-15 (×3): qty 5

## 2018-12-15 MED ORDER — ORAL CARE MOUTH RINSE
15.0000 mL | Freq: Two times a day (BID) | OROMUCOSAL | Status: DC
  Administered 2018-12-15 – 2018-12-17 (×4): 15 mL via OROMUCOSAL

## 2018-12-15 MED ORDER — CHLORHEXIDINE GLUCONATE 0.12 % MT SOLN
15.0000 mL | Freq: Two times a day (BID) | OROMUCOSAL | Status: DC
  Administered 2018-12-15 – 2018-12-17 (×4): 15 mL via OROMUCOSAL
  Filled 2018-12-15 (×4): qty 15

## 2018-12-15 MED ORDER — HEPARIN (PORCINE) 25000 UT/250ML-% IV SOLN
750.0000 [IU]/h | INTRAVENOUS | Status: DC
  Administered 2018-12-15: 750 [IU]/h via INTRAVENOUS

## 2018-12-15 NOTE — Progress Notes (Signed)
MD notified pt is too lethargic & uncooperative to receive anything by mouth & that if any  meds could be switched to IV. Will continue to monitor pt. Hoover Brunette, RN

## 2018-12-15 NOTE — Progress Notes (Signed)
ANTICOAGULATION CONSULT NOTE - Follow Up Consult  Pharmacy Consult for Xarelto to Heparin Indication: atrial fibrillation  Allergies  Allergen Reactions  . Avelox [Moxifloxacin Hcl In Nacl] Other (See Comments)    "didn't know what was going on, lost 3 days"  . Doxycycline Hives, Itching and Rash    Patient Measurements: Weight: 184 lb 11.9 oz (83.8 kg)  Ht 64 in IBW:55 kg Heparin dosing wt: 74 kg  Vital Signs: Temp: 97.7 F (36.5 C) (06/17 1320) Temp Source: Oral (06/17 1320) BP: 95/69 (06/17 1320) Pulse Rate: 112 (06/17 1320)  Labs: Recent Labs    12/14/18 1118 12/14/18 1701 12/15/18 0421 12/15/18 1726  HGB 12.6  --  12.1  --   HCT 37.1  --  36.0  --   PLT 164  --  156  --   APTT  --   --  182* >200*  HEPARINUNFRC  --   --  >2.20*  --   CREATININE 2.75* 2.78* 2.50*  --     Estimated Creatinine Clearance: 16.6 mL/min (A) (by C-G formula based on SCr of 2.5 mg/dL (H)).  Assessment: 85 year old female on Xarelto prior to admission for Afib Admitted with AKI and confusion, to transition to heparin Last dose of Xarelto 6/15. Anti-Xa level >2.2 (elevated due to Xarelto). Will utilize PTT for monitoring until PTT and heparin level until correlating.  aPTT came back undetectable (>200), on 950 units/hr. Heparin is infusing near R AC, level drawn from opposite side. No s/sx of bleeding. No infusion issues.   Goal of Therapy:  aPTT 66 to 102  seconds Monitor platelets by anticoagulation protocol: Yes  Heparin level 0.3 to 0.7 units/ml   Plan:  Hold heparin x 1 hour then restart heparin at 750 units / hr 8 hour PTT  Daily heparin level, CBC, PTT  Antonietta Jewel, PharmD, BCCCP Clinical Pharmacist  Pager: [phone] Phone: [phone] **Pharmacist phone directory can now be found on [email or web address] ([email or web address]).  Listed under [place]. 12/15/2018,7:09 PM

## 2018-12-15 NOTE — Progress Notes (Signed)
CRITICAL VALUE STICKER  CRITICAL VALUE: PTT greater than 200  RECEIVER (on-site recipient of call): Forrestine Him  DATE & TIME NOTIFIED: 12/15/18 at [facility] (representative from lab):   MD NOTIFIED:  MD Starla Link  TIME OF NOTIFICATION: 1853  RESPONSE:

## 2018-12-15 NOTE — Progress Notes (Signed)
ANTICOAGULATION CONSULT NOTE - Follow Up Consult  Pharmacy Consult for Xarelto to Heparin Indication: atrial fibrillation  Allergies  Allergen Reactions  . Avelox [Moxifloxacin Hcl In Nacl] Other (See Comments)    "didn't know what was going on, lost 3 days"  . Doxycycline Hives, Itching and Rash    Patient Measurements: Weight: 184 lb 11.9 oz (83.8 kg)  Ht 64 in IBW:55 kg Heparin dosing wt: 74 kg  Vital Signs: Temp: 97.8 F (36.6 C) (06/17 0543) Temp Source: Oral (06/17 0543) BP: 160/88 (06/17 0543) Pulse Rate: 89 (06/17 0543)  Labs: Recent Labs    12/14/18 1118 12/14/18 1701 12/15/18 0421  HGB 12.6  --  12.1  HCT 37.1  --  36.0  PLT 164  --  156  APTT  --   --  182*  HEPARINUNFRC  --   --  >2.20*  CREATININE 2.75* 2.78* 2.50*    Estimated Creatinine Clearance: 16.6 mL/min (A) (by C-G formula based on SCr of 2.5 mg/dL (H)).  Assessment: 85 year old female on Xarelto prior to admission for Afib Admitted with AKI and confusion, to transition to heparin Last dose of Xarelto 6/15. Anti-Xa level >2.2 (elevated due to Xarelto). Will utilize PTT for monitoring until PTT and heparin level until correlating.  PTT 182 sec (supratherapeutic) on gtt at 1150 units/hr. RN confirmed that labs drawn from arm opposite where heparin infusion. No bleeding noted.  Goal of Therapy:  aPTT 66 to 102  seconds Monitor platelets by anticoagulation protocol: Yes  Heparin level 0.3 to 0.7 units/ml   Plan:  Hold heparin x 1 hour then restart heparin at 950 units / hr 8 hour PTT  Daily heparin level, CBC, PTT  Sherlon Handing, PharmD, BCPS Clinical pharmacist  **Pharmacist phone directory can now be found on [email or web address] ([email or web address]).  Listed under [place]. 12/15/2018,6:29 AM

## 2018-12-15 NOTE — Progress Notes (Signed)
Folllow-up: Per RN and RRT assessment, pt resting comfortably in NAD. Continues to sat 94-95% on r/a. BBS CTA w/ diminished bases. Will hold off on further Lasix for now. Will continue to monitor closely.   Jeryl Columbia, NP-C [facility] Pager [phone]

## 2018-12-15 NOTE — Progress Notes (Signed)
Patient ID: CASILDA PICKERILL, female   DOB: August 17, 1929, 85 y.o.   MRN: [id number]  PROGRESS NOTE    STAYSHA TRUBY  WJX:[id number] DOB: 1930/06/11 DOA: 12/14/2018 PCP: Maury Dus, MD   Brief Narrative:  85 year old female with history of CAD, atrial fibrillation on Xarelto, hypertension, heart failure, CKD stage III, CVA and dementia presented with altered mental status.  Work-up in the nursing home revealed UTI and community-acquired pneumonia as per daughter's report.  In the ED, she was found to have hypercalcemia.She was started on IV fluids for hypercalcemia and Rocephin for probable UTI.  Assessment & Plan:   Principal Problem:   Altered mental status, unspecified Active Problems:   Atrial fibrillation (HCC)   Chronic systolic CHF (congestive heart failure) (HCC)   CKD (chronic kidney disease), stage III (HCC)   Elevated troponin  Acute metabolic encephalopathy in a patient with dementia  -Probably from hypercalcemia/dehydration along with probable UTI -On the time of my examination, patient was still hardly arousable -monitor mental status.  Fall precautions. -Palliative care evaluation for goals of care discussion.  CT of the head without contrast on admission showed no acute intracranial abnormality. -If condition does not improve, consider comfort measures. -Procalcitonin less than 0.1 -Initial SARS-CoV-2 negative  Hypercalcemia Probable dehydration -Presented with calcium of 13.8.  Calcium 11.8 this morning.  Continue IV fluids.  Patient was on calcium supplementation on presentation which has been discontinued.  Chronic systolic heart failure -Last echo showed EF of 20% in 2012.  No signs of acute decompensation at this time.  Currently on room air.  Will hold off on 2D echo.  Consider comfort measures if family agreeable. -Strict input and output.  Daily weights.  Probable UTI -Patient was given Rocephin in the nursing home for probable UTI.  Continue Rocephin for  now..  Follow cultures.  Chronic atrial fibrillation -Currently rate controlled.  Was on Xarelto as an outpatient.  Currently on heparin.  Acute kidney injury on chronic kidney disease stage IV -Creatinine improving.  Monitor.  Continue IV fluids  Hypertension -Monitor blood pressure.  Intermittently on the lower side.  Continue Coreg   DVT prophylaxis: Heparin Code Status: DNR Family Communication: None at bedside Disposition Plan: Depends on clinical outcome  Consultants: Palliative care  Procedures: None  Antimicrobials:  Anti-infectives (From admission, onward)   Start     Dose/Rate Route Frequency Ordered Stop   12/14/18 1800  cefTRIAXone (ROCEPHIN) 1 g in sodium chloride 0.9 % 100 mL IVPB     1 g 200 mL/hr over 30 Minutes Intravenous Every 24 hours 12/14/18 1644         Subjective: Patient seen and examined at bedside.  She hardly wakes up on calling her name.  No overnight fever or vomiting reported.  Objective: Vitals:   12/14/18 2238 12/15/18 0543 12/15/18 1220 12/15/18 1320  BP: 132/77 (!) 160/88 136/81 95/69  Pulse: 89 89 (!) 106 (!) 112  Resp: 16 16  20   Temp: 97.9 F (36.6 C) 97.8 F (36.6 C) 98 F (36.7 C) 97.7 F (36.5 C)  TempSrc: Oral Oral Oral Oral  SpO2: (!) 85% 90% 94% 97%  Weight:        Intake/Output Summary (Last 24 hours) at 12/15/2018 1434 Last data filed at 12/14/2018 2241 Gross per 24 hour  Intake 506.69 ml  Output 400 ml  Net 106.69 ml   Filed Weights   12/14/18 1549  Weight: 83.8 kg    Examination:  General exam: Looks  chronically ill.  Elderly female.  Hardly wakes up on calling her name. Respiratory system: Bilateral decreased breath sounds at bases with scattered crackles Cardiovascular system: S1 & S2 heard, intermittently tachycardic Gastrointestinal system: Abdomen is nondistended, soft and nontender. Normal bowel sounds heard. Extremities: No cyanosis, clubbing; trace edema Central nervous system: Hardly arousable.   No focal neurological deficits Skin: No rashes, lesions or ulcers Psychiatry: Could not be assessed because of mental status    Data Reviewed: I have personally reviewed following labs and imaging studies  CBC: Recent Labs  Lab 12/14/18 1118 12/15/18 0421  WBC 7.1 8.8  NEUTROABS 4.9  --   HGB 12.6 12.1  HCT 37.1 36.0  MCV 90.7 92.3  PLT 164 161   Basic Metabolic Panel: Recent Labs  Lab 12/14/18 1118 12/14/18 1505 12/14/18 1701 12/15/18 0421  NA 143  --  144 145  K 2.5*  --  2.6* 3.6  CL 101  --  100 109  CO2 28  --  29 23  GLUCOSE 96  --  93 121*  BUN 39*  --  38* 37*  CREATININE 2.75*  --  2.78* 2.50*  CALCIUM 13.8*  --  13.5* 11.8*  MG  --  1.1*  --   --    GFR: Estimated Creatinine Clearance: 16.6 mL/min (A) (by C-G formula based on SCr of 2.5 mg/dL (H)). Liver Function Tests: Recent Labs  Lab 12/14/18 1118 12/15/18 0421  AST 45* 55*  ALT 24 27  ALKPHOS 70 70  BILITOT 1.3* 1.5*  PROT 6.6 6.2*  ALBUMIN 3.3* 3.1*   No results for input(s): LIPASE, AMYLASE in the last 168 hours. No results for input(s): AMMONIA in the last 168 hours. Coagulation Profile: No results for input(s): INR, PROTIME in the last 168 hours. Cardiac Enzymes: No results for input(s): CKTOTAL, CKMB, CKMBINDEX, TROPONINI in the last 168 hours. BNP (last 3 results) No results for input(s): PROBNP in the last 8760 hours. HbA1C: No results for input(s): HGBA1C in the last 72 hours. CBG: Recent Labs  Lab 12/14/18 1043  GLUCAP 90   Lipid Profile: No results for input(s): CHOL, HDL, LDLCALC, TRIG, CHOLHDL, LDLDIRECT in the last 72 hours. Thyroid Function Tests: No results for input(s): TSH, T4TOTAL, FREET4, T3FREE, THYROIDAB in the last 72 hours. Anemia Panel: No results for input(s): VITAMINB12, FOLATE, FERRITIN, TIBC, IRON, RETICCTPCT in the last 72 hours. Sepsis Labs: Recent Labs  Lab 12/14/18 1701 12/14/18 2119 12/15/18 0421  PROCALCITON <0.10  --  <0.10  LATICACIDVEN  1.5 1.3  --     Recent Results (from the past 240 hour(s))  Novel Coronavirus,NAA,(SEND-OUT TO REF LAB - TAT 24-48 hrs); Hosp Order     Status: None   Collection Time: 12/14/18  1:15 PM   Specimen: Nasopharyngeal Swab; Respiratory  Result Value Ref Range Status   SARS-CoV-2, NAA NOT DETECTED NOT DETECTED Final    Comment: (NOTE) This test was developed and its performance characteristics determined by [employer]. This test has not been FDA cleared or approved. This test has been authorized by FDA under an Emergency Use Authorization (EUA). This test is only authorized for the duration of time the declaration that circumstances exist justifying the authorization of the emergency use of in vitro diagnostic tests for detection of SARS-CoV-2 virus and/or diagnosis of COVID-19 infection under section 564(b)(1) of the Act, 21 U.S.C. 096EAV-4(U)(9), unless the authorization is terminated or revoked sooner. When diagnostic testing is negative, the possibility of a false negative  result should be considered in the context of a patient's recent exposures and the presence of clinical signs and symptoms consistent with COVID-19. An individual without symptoms of COVID-19 and who is not shedding SARS-CoV-2 virus would expect to have a negative (not detected) result in this assay. Performed  At: [facility] [address] [place], [place] [id number] Rush Farmer MD WV:[id number]    [place]  Final    Comment: Performed at [facility], [place] [address], [place], [place] [address]  MRSA PCR Screening     Status: Abnormal   Collection Time: 12/14/18  3:53 PM   Specimen: Nasopharyngeal  Result Value Ref Range Status   MRSA by PCR POSITIVE (A) NEGATIVE Final    Comment:        The GeneXpert MRSA Assay (FDA approved for NASAL specimens only), is one component of a comprehensive MRSA colonization surveillance program. It is not intended to  diagnose MRSA infection nor to guide or monitor treatment for MRSA infections. RESULT CALLED TO, READ BACK BY AND VERIFIED WITHJefferson Fuel RN 12/14/18 1748 JDW Performed at [facility], [place] [address] [place], [place] [address]   Urine Culture     Status: None   Collection Time: 12/14/18  7:42 PM   Specimen: Urine, Random  Result Value Ref Range Status   Specimen Description URINE, RANDOM  Final   Special Requests NONE  Final   Culture   Final    NO GROWTH Performed at [facility], [place] [address], [place], [place] [address]    Report Status 12/15/2018 FINAL  Final         Radiology Studies: Ct Head Wo Contrast  Result Date: 12/14/2018 CLINICAL DATA:  Altered level of consciousness.  Combative. EXAM: CT HEAD WITHOUT CONTRAST TECHNIQUE: Contiguous axial images were obtained from the base of the skull through the vertex without intravenous contrast. COMPARISON:  MRI and CT 09/30/2018 FINDINGS: Brain: Generalized atrophy. Considerable motion degradation. Chronic small-vessel changes of the white matter. No sign of acute infarction, mass lesion, hemorrhage, hydrocephalus or extra-axial collection. Vascular: No abnormal vascular finding. Skull: Negative as seen. Sinuses/Orbits: Negative as seen. Other: None IMPRESSION: Pronounced motion degradation. Generalized atrophy. Chronic small-vessel ischemic changes of the white matter. No acute finding appreciable. Electronically Signed   By: Nelson Chimes M.D.   On: 12/14/2018 12:36   Dg Chest Port 1 View  Result Date: 12/14/2018 CLINICAL DATA:  Shortness of breath.  History of recent pneumonia. EXAM: PORTABLE CHEST 1 VIEW COMPARISON:  09/30/2018 FINDINGS: Heart size is enlarged. Aortic calcifications are noted. Bibasilar atelectasis is noted. There may be trace bilateral pleural effusions. No pneumothorax. There is suggestion of mild volume overload. There is a linear metallic density projecting over the right upper lobe, favored to  be external to the patient. IMPRESSION: Cardiomegaly with mild volume overload. Electronically Signed   By: Constance Holster M.D.   On: 12/14/2018 14:28        Scheduled Meds: . atorvastatin  40 mg Oral QODAY  . calcitonin  4 Units/kg Subcutaneous BID  . carvedilol  12.5 mg Oral BID WC  . Chlorhexidine Gluconate Cloth  6 each Topical Q0600  . cholecalciferol  2,000 Units Oral Daily  . levothyroxine  125 mcg Oral Q0600  . lidocaine  1 patch Transdermal Q24H  . mupirocin ointment   Nasal BID  . nystatin  1 Bottle Topical BID  . potassium chloride  10 mEq Oral Daily  .  senna-docusate  2 tablet Oral QHS   Continuous Infusions: . sodium chloride 150 mL/hr at 12/15/18 1338  . cefTRIAXone (ROCEPHIN)  IV 1 g (12/14/18 1748)  . heparin 950 Units/hr (12/15/18 0845)     LOS: 1 day        Aline August, MD [facility] 12/15/2018, 2:34 PM

## 2018-12-15 NOTE — Consult Note (Signed)
Consultation Note Date: 12/15/2018   Patient Name: [name]  DOB: [birth year]  MRN: [id number]  Age / Sex: 85 y.o., female  PCP: Maury Dus, MD Referring Physician: Aline August, MD  Reason for Consultation: Establishing goals of care and Psychosocial/spiritual support  HPI/Patient Profile: 85 y.o. female   admitted on 12/14/2018 with past medical history significant for CAD, atrial fibrillation on Xarelto, hypertension, HFrEF, CKD stage III and history of CVA and dementia who was noted to have some mild alteration in mental status by nursing staff/SNF.  Patient was initially treated conservatively however her delirium and confusion worsened.  Work-up in the nursing home apparently revealed a UTI and community-acquired pneumonia per daughter.  Per review of medications patient was given a dose of ceftriaxone yesterday.  Recent labs however showed elevated calcium and hypokalemia and patient was referred to ED for evaluation and admission.  Family was unable to visit at the nursing facility for the last few months secondary to COVID-19  Family reports continued physical, functional and cognitive decline over the past 6 months.  Family face treatment option decisions, advanced directive decisions and anticipatory care needs.  Clinical Assessment and Goals of Care:  This NP Wadie Lessen reviewed medical records, received report from team, assessed the patient and then by phone with daughter to discuss diagnosis, prognosis, GOC, EOL wishes disposition and options.  Concept of  Palliative Care were discussed  A  discussion was had today regarding advanced directives.  Concepts specific to code status, artifical feeding and hydration, continued IV antibiotics and rehospitalization was had.  The difference between a aggressive medical intervention path  and a palliative comfort care path for this patient  at this time was had.  Values and goals of care important to patient and family were attempted to be elicited.  Questions and concerns addressed.   Family encouraged to call with questions or concerns.    PMT will continue to support holistically.    Daughter is POA and main Media planner, she is looking for paperwork.  There is a living son but according to daughter he is not much involved.    SUMMARY OF RECOMMENDATIONS    Code Status/Advance Care Planning:  DNR   Palliative Prophylaxis:   Aspiration, Bowel Regimen, Delirium Protocol, Frequent Pain Assessment and Oral Care  Additional Recommendations (Limitations, Scope, Preferences):  Full Scope Treatment   Continue current treatment plan for the next 24 hours.  Daughter remains hopeful for improvement  Palliaitve medicine team will meet tomorrow afternoon with the daughter in person to have continued discussion regarding goals of care and treatment plan.  Scheduled meeting for 4:30 tomorrow afternoon   Psycho-social/Spiritual:   Desire for further Chaplaincy support:no  Created space and opportunity for daughter to explore her thoughts and feelings regarding her mother's current medical situation.  She verbalizes an understanding that her mother is declining and would not want "heroics".  If her mother could not achieve some meaningful quality of life, ultimately the goal would be to allow natural death.  Additional Recommendations: Education on Hospice  Prognosis:   Unable to determine  Discharge Planning: To Be Determined      Primary Diagnoses: Present on Admission: . Altered mental status, unspecified . Atrial fibrillation ([place]) . Chronic systolic CHF (congestive heart failure) ([place]) . CKD (chronic kidney disease), stage III ([place]) . Elevated troponin   I have reviewed the medical record, interviewed the patient and family, and examined the patient. The following aspects are pertinent.  Past Medical  History:  Diagnosis Date  . Atrial fibrillation ([place])   . CAD (coronary artery disease) 06/2010 cath   Occluded distal RCA, moderately diffuse LCx and LAD disease out of proportion to LV dysfxn, and significant MR  . CHF (congestive heart failure) (HCC)    EF 20-25% and severe MR   . CKD (chronic kidney disease)    Baseline Cr 1.1-1.2   . CVA (cerebral infarction)    Left hemiparesis  . Hyperlipidemia   . Hypertension   . Hypothyroidism   . 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 Venous insufficiency    Chronic LE edema   Social History   Socioeconomic History  . Marital status: Single    Spouse name: Not on file  . Number of children: Not on file  . Years of education: Not on file  . Highest education level: Not on file  Occupational History  . Not on file  Social Needs  . Financial resource strain: Not on file  . Food insecurity    Worry: Not on file    Inability: Not on file  . Transportation needs    Medical: Not on file    Non-medical: Not on file  Tobacco Use  . Smoking status: Former Research scientist (life sciences)  . Smokeless tobacco: Never Used  . Tobacco comment: quit smoking 20-30 years ago  Substance and Sexual Activity  . Alcohol use: No  . Drug use: No  . Sexual activity: Not on file  Lifestyle  . Physical activity    Days per week: Not on file    Minutes per session: Not on file  . Stress: Not on file  Relationships  . Social Herbalist on phone: Not on file    Gets together: Not on file    Attends religious service: Not on file    Active member of club or organization: Not on file    Attends meetings of clubs or organizations: Not on file    Relationship status: Not on file  Other Topics Concern  . Not on file  Social History Narrative   She lives at home alone and has limited mobility with a walker, but still does get around a little. Her daughter works right next to her, so she is checked on several times a day. She quit smoking 20-30 yr  ago at the time of the stroke.    Family History  Problem Relation Age of Onset  . Cancer Brother    Scheduled Meds: . atorvastatin  40 mg Oral QODAY  . calcitonin  4 Units/kg Subcutaneous BID  . carvedilol  12.5 mg Oral BID WC  . Chlorhexidine Gluconate Cloth  6 each Topical Q0600  . cholecalciferol  2,000 Units Oral Daily  . levothyroxine  125 mcg Oral Q0600  . lidocaine  1 patch Transdermal Q24H  . mupirocin ointment   Nasal BID  . nystatin  1 Bottle Topical BID  . potassium chloride  10 mEq Oral Daily  .  senna-docusate  2 tablet Oral QHS   Continuous Infusions: . sodium chloride 150 mL/hr at 12/15/18 0637  . cefTRIAXone (ROCEPHIN)  IV 1 g (12/14/18 1748)  . heparin     PRN Meds:.lactulose Medications Prior to Admission:  Prior to Admission medications   Medication Sig Start Date End Date Taking? Authorizing Provider  acetaminophen (TYLENOL) 325 MG tablet Take 2 tablets (650 mg total) by mouth every 6 (six) hours as needed for mild pain, moderate pain or headache (or Fever >/= 101). 10/01/18  Yes Emokpae, Courage, MD  alendronate (FOSAMAX) 70 MG tablet Take 70 mg by mouth once a week. thursday 01/07/16  Yes [provider]  atorvastatin (LIPITOR) 40 MG tablet Take 40 mg by mouth every other day.    Yes [provider]  calcium carbonate (OSCAL) 1500 (600 Ca) MG TABS tablet Take 600 mg of elemental calcium by mouth 2 (two) times daily with a meal.    Yes [provider]  carvedilol (COREG) 6.25 MG tablet Take 1 tablet (6.25 mg total) by mouth 2 (two) times daily with a meal. Patient taking differently: Take 12.5 mg by mouth 2 (two) times daily with a meal.  10/01/18  Yes Emokpae, Courage, MD  cefTRIAXone 1 g/4 mLs in lidocaine (with preservative) injection Inject 1 g into the muscle once. PNA \ UTI   Yes [provider]  cholecalciferol (VITAMIN D) 25 MCG (1000 UT) tablet Take 2,000 Units by mouth daily.   Yes [provider]  ferrous  gluconate (FERGON) 324 MG tablet Take 1 tablet (324 mg total) by mouth daily with breakfast. 01/24/16  Yes Gherghe, Vella Redhead, MD  furosemide (LASIX) 40 MG tablet Take 40 mg by mouth daily.   Yes [provider]  lactulose (CHRONULAC) 10 GM/15ML solution Take 10 g by mouth daily as needed for mild constipation.   Yes [provider]  levothyroxine (SYNTHROID, LEVOTHROID) 125 MCG tablet Take 125 mcg by mouth daily before breakfast.   Yes [provider]  lidocaine (LIDODERM) 5 % Place 1 patch onto the skin daily. Remove & Discard patch within 12 hours or as directed by MD   Yes [provider]  LORazepam (ATIVAN) 2 MG/ML injection Inject 2 mg into the muscle once. For severe Agitation   Yes [provider]  nystatin (NYSTATIN) powder Apply 1 Bottle topically 2 (two) times daily. 1 Billion units Apply under Breasts   Yes [provider]  OVER THE COUNTER MEDICATION Take 17 g by mouth daily. Gavilax power   Yes [provider]  OXYGEN Inhale 2 L into the lungs continuous. Nasal Cannula   Yes [provider]  potassium chloride (K-DUR,KLOR-CON) 10 MEQ tablet Take 10 mEq by mouth daily.    Yes [provider]  senna-docusate (SENOKOT-S) 8.6-50 MG tablet Take 2 tablets by mouth at bedtime. 10/01/18  Yes Emokpae, Courage, MD  solifenacin (VESICARE) 10 MG tablet Take 10 mg by mouth daily. 07/23/18  Yes [provider]  XARELTO 20 MG TABS tablet Take 20 mg by mouth every evening. 07/30/18  Yes [provider]  losartan (COZAAR) 25 MG tablet Take 1 tablet (25 mg total) by mouth daily. Patient not taking: Reported on 12/14/2018 10/01/18   Roxan Hockey, MD   Allergies  Allergen Reactions  . Avelox [Moxifloxacin Hcl In Nacl] Other (See Comments)    "didn't know what was going on, lost 3 days"  . Doxycycline Hives, Itching and Rash   Review of  Systems  Unable to perform ROS: Mental status change    Physical  Exam Constitutional:      Appearance: She is underweight. She is ill-appearing.  Cardiovascular:     Rate and Rhythm: Normal rate and regular rhythm.     Heart sounds: Normal heart sounds.  Pulmonary:     Breath sounds: Decreased breath sounds present.  Musculoskeletal:     Comments: generalized weakness and muscle atrophy  Skin:    General: Skin is warm and dry.  Neurological:     Mental Status: She is lethargic.  Psychiatric:        Speech: She is noncommunicative.        Cognition and Memory: Cognition is impaired.     Vital Signs: BP (!) 160/88 (BP Location: Left Arm)   Pulse 89   Temp 97.8 F (36.6 C) (Oral)   Resp 16   Wt 83.8 kg   SpO2 90%   BMI 31.71 kg/m  Pain Scale: PAINAD       SpO2: SpO2: 90 % O2 Device:SpO2: 90 % O2 Flow Rate: .   IO: Intake/output summary:   Intake/Output Summary (Last 24 hours) at 12/15/2018 0851 Last data filed at 12/14/2018 2241 Gross per 24 hour  Intake 606.69 ml  Output 400 ml  Net 206.69 ml    LBM: Last BM Date: 12/14/18 Baseline Weight: Weight: 83.8 kg Most recent weight: Weight: 83.8 kg     Palliative Assessment/Data: 30 % at best today    Discussed with Dr Starla Link  Time In: 1050 Time Out: 1200 Time Total: 70 minutes Greater than 50%  of this time was spent counseling and coordinating care related to the above assessment and plan.  Signed by: Wadie Lessen, NP   Please contact Palliative Medicine Team phone at [phone] for questions and concerns.  For individual provider: See Shea Evans

## 2018-12-16 DIAGNOSIS — R451 Restlessness and agitation: Secondary | ICD-10-CM

## 2018-12-16 DIAGNOSIS — N179 Acute kidney failure, unspecified: Secondary | ICD-10-CM

## 2018-12-16 DIAGNOSIS — R0609 Other forms of dyspnea: Secondary | ICD-10-CM

## 2018-12-16 DIAGNOSIS — Z515 Encounter for palliative care: Secondary | ICD-10-CM

## 2018-12-16 DIAGNOSIS — Z66 Do not resuscitate: Secondary | ICD-10-CM

## 2018-12-16 DIAGNOSIS — R0600 Dyspnea, unspecified: Secondary | ICD-10-CM

## 2018-12-16 LAB — CBC
HCT: 37.8 % (ref 36.0–46.0)
Hemoglobin: 12.4 g/dL (ref 12.0–15.0)
MCH: 30.7 pg (ref 26.0–34.0)
MCHC: 32.8 g/dL (ref 30.0–36.0)
MCV: 93.6 fL (ref 80.0–100.0)
Platelets: 172 10*3/uL (ref 150–400)
RBC: 4.04 MIL/uL (ref 3.87–5.11)
RDW: 15.1 % (ref 11.5–15.5)
WBC: 13.4 10*3/uL — ABNORMAL HIGH (ref 4.0–10.5)
nRBC: 0 % (ref 0.0–0.2)

## 2018-12-16 LAB — COMPREHENSIVE METABOLIC PANEL
ALT: 23 U/L (ref 0–44)
AST: 34 U/L (ref 15–41)
Albumin: 3 g/dL — ABNORMAL LOW (ref 3.5–5.0)
Alkaline Phosphatase: 68 U/L (ref 38–126)
Anion gap: 13 (ref 5–15)
BUN: 38 mg/dL — ABNORMAL HIGH (ref 8–23)
CO2: 21 mmol/L — ABNORMAL LOW (ref 22–32)
Calcium: 10.6 mg/dL — ABNORMAL HIGH (ref 8.9–10.3)
Chloride: 116 mmol/L — ABNORMAL HIGH (ref 98–111)
Creatinine, Ser: 3.27 mg/dL — ABNORMAL HIGH (ref 0.44–1.00)
GFR calc Af Amer: 14 mL/min — ABNORMAL LOW (ref >60)
GFR calc non Af Amer: 12 mL/min — ABNORMAL LOW (ref >60)
Glucose, Bld: 125 mg/dL — ABNORMAL HIGH (ref 70–99)
Potassium: 2.8 mmol/L — ABNORMAL LOW (ref 3.5–5.1)
Sodium: 150 mmol/L — ABNORMAL HIGH (ref 135–145)
Total Bilirubin: 1 mg/dL (ref 0.3–1.2)
Total Protein: 6.4 g/dL — ABNORMAL LOW (ref 6.5–8.1)

## 2018-12-16 LAB — CALCIUM, IONIZED: Calcium, Ionized, Serum: 6.3 mg/dL — ABNORMAL HIGH (ref 4.5–5.6)

## 2018-12-16 LAB — HEPARIN LEVEL (UNFRACTIONATED): Heparin Unfractionated: 2.12 IU/mL — ABNORMAL HIGH (ref 0.30–0.70)

## 2018-12-16 LAB — VITAMIN B12: Vitamin B-12: 327 pg/mL (ref 180–914)

## 2018-12-16 LAB — FOLATE: Folate: 16.9 ng/mL (ref >5.9)

## 2018-12-16 LAB — TSH: TSH: 0.576 u[IU]/mL (ref 0.350–4.500)

## 2018-12-16 LAB — AMMONIA: Ammonia: 21 umol/L (ref 9–35)

## 2018-12-16 LAB — MAGNESIUM: Magnesium: 1 mg/dL — ABNORMAL LOW (ref 1.7–2.4)

## 2018-12-16 LAB — APTT: aPTT: 155 seconds — ABNORMAL HIGH (ref 24–36)

## 2018-12-16 LAB — PROCALCITONIN: Procalcitonin: <0.1 ng/mL

## 2018-12-16 MED ORDER — MORPHINE SULFATE (CONCENTRATE) 10 MG/0.5ML PO SOLN
5.0000 mg | ORAL | Status: DC | PRN
  Administered 2018-12-16 – 2018-12-17 (×3): 5 mg via ORAL
  Filled 2018-12-16 (×3): qty 0.5

## 2018-12-16 MED ORDER — HEPARIN (PORCINE) 25000 UT/250ML-% IV SOLN: 600.0000 [IU]/h | INTRAVENOUS | Status: DC

## 2018-12-16 MED ORDER — LORAZEPAM 2 MG/ML IJ SOLN: 1.0000 mg | INTRAMUSCULAR | Status: DC | PRN

## 2018-12-16 NOTE — Progress Notes (Signed)
ANTICOAGULATION CONSULT NOTE - Follow up  Pharmacy Consult for  for Xarelto to Heparin Indication: atrial fibrillation  Allergies  Allergen Reactions  . Avelox [Moxifloxacin Hcl In Nacl] Other (See Comments)    "didn't know what was going on, lost 3 days"  . Doxycycline Hives, Itching and Rash    Patient Measurements: Height: 5\' 4"  (162.6 cm)(from 09/30/2018) Weight: 187 lb 6.3 oz (85 kg) IBW/kg (Calculated) : 54.7 Heparin Dosing Weight: 73.4 kg  Vital Signs: Temp: 98.3 F (36.8 C) (06/18 0529) Temp Source: Oral (06/18 0529) BP: 146/117 (06/18 0529) Pulse Rate: 61 (06/18 0529)  Labs: Recent Labs    12/14/18 1118 12/14/18 1701 12/15/18 0421 12/15/18 1726 12/16/18 0724 12/16/18 0729  HGB 12.6  --  12.1  --  12.4  --   HCT 37.1  --  36.0  --  37.8  --   PLT 164  --  156  --  172  --   APTT  --   --  182* >200* 155*  --   HEPARINUNFRC  --   --  >2.20*  --   --  2.12*  CREATININE 2.75* 2.78* 2.50*  --  3.27*  --     Estimated Creatinine Clearance: 12.8 mL/min (A) (by C-G formula based on SCr of 3.27 mg/dL (H)).   Medical History: Past Medical History:  Diagnosis Date  . Atrial fibrillation ([facility])   . CAD (coronary artery disease) 06/2010 cath   Occluded distal RCA, moderately diffuse LCx and LAD disease out of proportion to LV dysfxn, and significant MR  . CHF (congestive heart failure) (HCC)    EF 20-25% and severe MR   . CKD (chronic kidney disease)    Baseline Cr 1.1-1.2   . CVA (cerebral infarction)    Left hemiparesis  . Hyperlipidemia   . Hypertension   . Hypothyroidism   . 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 Venous insufficiency    Chronic LE edema    Assessment: 85 year old female on Xarelto prior to admission for Afib Admitted with AKI and confusion, to transition to heparin Last dose of Xarelto 6/15.  Will utilize PTT for monitoring until PTT and heparin level until correlating.  APTT = 155 seconds after heparin held x 1 hour  last night then reduced rate to 750 units/hr. APTT supratherapeutic although has decreased from previous result last PM.  Heparin is infusing near R AC, level drawn from opposite side. No infusion issues.  No s/sx of bleeding. CBC remains within normal. Heparin level remains  elevated at 2.12 due to effect of prior Xarelto, but starting to trend down. Still need to monitor heparin using aPTT.    Goal of Therapy:  Heparin level 0.3-0.7 units/ml aPTT 66-102 seconds Monitor platelets by anticoagulation protocol: Yes   Plan:  Hold heparin x 1 hour then restart heparin at 600 units / hr 8 hour PTT  Daily heparin level, CBC, PTT  Thank you for allowing pharmacy to be part of this patients care team.  Nicole Cella, RPh Clinical Pharmacist [phone] Pager: [phone] Please check AMION for all [facility] phone numbers After 10:00 PM, call [place] [phone] 12/16/2018,9:50 AM

## 2018-12-16 NOTE — Progress Notes (Signed)
Patient ID: [name], female   DOB: March 21, 1930, 85 y.o.   MRN: [id number]  PROGRESS NOTE    [name]  WCH:[id number] DOB: April 17, 1930 DOA: 12/14/2018 PCP: Maury Dus, MD   Brief Narrative:  85 year old female with history of CAD, atrial fibrillation on Xarelto, hypertension, heart failure, CKD stage III, CVA and dementia presented with altered mental status.  Work-up in the nursing home revealed UTI and community-acquired pneumonia as per daughter's report.  In the ED, she was found to have hypercalcemia.She was started on IV fluids for hypercalcemia and Rocephin for probable UTI.  Assessment & Plan:   Principal Problem:   Altered mental status, unspecified Active Problems:   Atrial fibrillation (HCC)   Chronic systolic CHF (congestive heart failure) (HCC)   CKD (chronic kidney disease), stage III (HCC)   Elevated troponin   Palliative care by specialist   DNR (do not resuscitate)  Acute metabolic encephalopathy in a patient with dementia  -Probably from hypercalcemia/dehydration along with probable UTI -On the time of my examination, patient was still hardly arousable -monitor mental status.  Fall precautions. -CT of the head without contrast on admission showed no acute intracranial abnormality. -Procalcitonin less than 0.1 -Initial SARS-CoV-2 negative -Patient's overall condition has not improved and she is hardly arousable still.  Recommend comfort measures.  Palliative care evaluation appreciated.  Palliative care is supposed to meet with the daughter today afternoon. -Patient is not awake enough to swallow any of her oral meds.  Hypercalcemia Probable dehydration -Presented with calcium of 13.8.  Calcium 10.6 this morning.  Patient was on calcium supplementation on presentation which has been discontinued. -DC IV fluids as patient is becoming hypernatremic  Hypernatremia Hypokalemia Hypomagnesemia -We will wait for palliative care discussion today.  We will  not repeat a.m. labs.  Recommend comfort measures.  Chronic systolic heart failure -Last echo showed EF of 20% in 2012.  No signs of acute decompensation at this time.  Currently on room air.  Will hold off on 2D echo.  Consider comfort measures if family agreeable.  Probable UTI -Patient was given Rocephin in the nursing home for probable UTI.  Continue Rocephin for now.  Urine culture negative for now..  Chronic atrial fibrillation -Currently rate controlled.  Was on Xarelto as an outpatient.  Currently on heparin.  Recommend discontinuing heparin drip.  Acute kidney injury on chronic kidney disease stage IV -Creatinine worsening, 3.27 today.  Plan as above  Hypertension -Monitor blood pressure.  Intermittently on the lower side.    DVT prophylaxis: Heparin Code Status: DNR Family Communication: None at bedside Disposition Plan: Depends on clinical outcome  Consultants: Palliative care  Procedures: None  Antimicrobials:  Anti-infectives (From admission, onward)   Start     Dose/Rate Route Frequency Ordered Stop   12/14/18 1800  cefTRIAXone (ROCEPHIN) 1 g in sodium chloride 0.9 % 100 mL IVPB     1 g 200 mL/hr over 30 Minutes Intravenous Every 24 hours 12/14/18 1644          Subjective: Patient seen and examined at bedside.  She does not wake up at all.  No overnight fever or vomiting reported. Objective: Vitals:   12/15/18 2200 12/16/18 0500 12/16/18 0529 12/16/18 0900  BP: (!) 120/97  (!) 146/117   Pulse: (!) 124  61   Resp:      Temp: 98.8 F (37.1 C)  98.3 F (36.8 C)   TempSrc: Oral  Oral   SpO2: 91%  93%  Weight:  85 kg    Height:    5\' 4"  (1.626 m)    Intake/Output Summary (Last 24 hours) at 12/16/2018 1038 Last data filed at 12/16/2018 0345 Gross per 24 hour  Intake 3791.12 ml  Output --  Net 3791.12 ml   Filed Weights   12/14/18 1549 12/16/18 0500  Weight: 83.8 kg 85 kg    Examination:  General exam: Looks chronically ill.  Elderly female.   Does not wake up at all.  Respiratory system: Bilateral decreased breath sounds at bases with scattered crackles.  No wheezing Cardiovascular system: S1 & S2 heard, still tachycardic gastrointestinal system: Abdomen is nondistended, soft and nontender. Normal bowel sounds heard. Extremities: No cyanosis; trace edema Central nervous system: Very hardly arousable.  No focal neurological deficits Skin: No rashes or ulcers Psychiatry: Could not be assessed because of mental status    Data Reviewed: I have personally reviewed following labs and imaging studies  CBC: Recent Labs  Lab 12/14/18 1118 12/15/18 0421 12/16/18 0724  WBC 7.1 8.8 13.4*  NEUTROABS 4.9  --   --   HGB 12.6 12.1 12.4  HCT 37.1 36.0 37.8  MCV 90.7 92.3 93.6  PLT 164 156 811   Basic Metabolic Panel: Recent Labs  Lab 12/14/18 1118 12/14/18 1505 12/14/18 1701 12/15/18 0421 12/16/18 0724  NA 143  --  144 145 150*  K 2.5*  --  2.6* 3.6 2.8*  CL 101  --  100 109 116*  CO2 28  --  29 23 21*  GLUCOSE 96  --  93 121* 125*  BUN 39*  --  38* 37* 38*  CREATININE 2.75*  --  2.78* 2.50* 3.27*  CALCIUM 13.8*  --  13.5* 11.8* 10.6*  MG  --  1.1*  --   --  1.0*   GFR: Estimated Creatinine Clearance: 12.8 mL/min (A) (by C-G formula based on SCr of 3.27 mg/dL (H)). Liver Function Tests: Recent Labs  Lab 12/14/18 1118 12/15/18 0421 12/16/18 0724  AST 45* 55* 34  ALT 24 27 23   ALKPHOS 70 70 68  BILITOT 1.3* 1.5* 1.0  PROT 6.6 6.2* 6.4*  ALBUMIN 3.3* 3.1* 3.0*   No results for input(s): LIPASE, AMYLASE in the last 168 hours. Recent Labs  Lab 12/16/18 0724  AMMONIA 21   Coagulation Profile: No results for input(s): INR, PROTIME in the last 168 hours. Cardiac Enzymes: No results for input(s): CKTOTAL, CKMB, CKMBINDEX, TROPONINI in the last 168 hours. BNP (last 3 results) No results for input(s): PROBNP in the last 8760 hours. HbA1C: No results for input(s): HGBA1C in the last 72 hours. CBG: Recent Labs    Lab 12/14/18 1043  GLUCAP 90   Lipid Profile: No results for input(s): CHOL, HDL, LDLCALC, TRIG, CHOLHDL, LDLDIRECT in the last 72 hours. Thyroid Function Tests: Recent Labs    12/16/18 0724  TSH 0.576   Anemia Panel: Recent Labs    12/16/18 0724  VITAMINB12 327  FOLATE 16.9   Sepsis Labs: Recent Labs  Lab 12/14/18 1701 12/14/18 2119 12/15/18 0421 12/16/18 0724  PROCALCITON <0.10  --  <0.10 <0.10  LATICACIDVEN 1.5 1.3  --   --     Recent Results (from the past 240 hour(s))  Novel Coronavirus,NAA,(SEND-OUT TO REF LAB - TAT 24-48 hrs); Hosp Order     Status: None   Collection Time: 12/14/18  1:15 PM   Specimen: Nasopharyngeal Swab; Respiratory  Result Value Ref Range Status   SARS-CoV-2, NAA NOT  DETECTED NOT DETECTED Final    Comment: (NOTE) This test was developed and its performance characteristics determined by [employer]. This test has not been FDA cleared or approved. This test has been authorized by FDA under an Emergency Use Authorization (EUA). This test is only authorized for the duration of time the declaration that circumstances exist justifying the authorization of the emergency use of in vitro diagnostic tests for detection of SARS-CoV-2 virus and/or diagnosis of COVID-19 infection under section 564(b)(1) of the Act, 21 U.S.C. 474QVZ-5(G)(3), unless the authorization is terminated or revoked sooner. When diagnostic testing is negative, the possibility of a false negative result should be considered in the context of a patient's recent exposures and the presence of clinical signs and symptoms consistent with COVID-19. An individual without symptoms of COVID-19 and who is not shedding SARS-CoV-2 virus would expect to have a negative (not detected) result in this assay. Performed  At: [facility] [address] [place], [place] [id number] Rush Farmer MD JJ:[id number]    [place]  Final    Comment:  Performed at [facility], [place] [address] [place], [place] [address]  MRSA PCR Screening     Status: Abnormal   Collection Time: 12/14/18  3:53 PM   Specimen: Nasopharyngeal  Result Value Ref Range Status   MRSA by PCR POSITIVE (A) NEGATIVE Final    Comment:        The GeneXpert MRSA Assay (FDA approved for NASAL specimens only), is one component of a comprehensive MRSA colonization surveillance program. It is not intended to diagnose MRSA infection nor to guide or monitor treatment for MRSA infections. RESULT CALLED TO, READ BACK BY AND VERIFIED WITHJefferson Fuel RN 12/14/18 1748 JDW Performed at [facility], [place] [address] [place], [place] [address]   Urine Culture     Status: None   Collection Time: 12/14/18  7:42 PM   Specimen: Urine, Random  Result Value Ref Range Status   Specimen Description URINE, RANDOM  Final   Special Requests NONE  Final   Culture   Final    NO GROWTH Performed at [facility], [place] [address], [place], [place] [address]    Report Status 12/15/2018 FINAL  Final         Radiology Studies: Ct Head Wo Contrast  Result Date: 12/14/2018 CLINICAL DATA:  Altered level of consciousness.  Combative. EXAM: CT HEAD WITHOUT CONTRAST TECHNIQUE: Contiguous axial images were obtained from the base of the skull through the vertex without intravenous contrast. COMPARISON:  MRI and CT 09/30/2018 FINDINGS: Brain: Generalized atrophy. Considerable motion degradation. Chronic small-vessel changes of the white matter. No sign of acute infarction, mass lesion, hemorrhage, hydrocephalus or extra-axial collection. Vascular: No abnormal vascular finding. Skull: Negative as seen. Sinuses/Orbits: Negative as seen. Other: None IMPRESSION: Pronounced motion degradation. Generalized atrophy. Chronic small-vessel ischemic changes of the white matter. No acute finding appreciable. Electronically Signed   By: Nelson Chimes M.D.   On: 12/14/2018 12:36   Dg Chest  Port 1 View  Result Date: 12/14/2018 CLINICAL DATA:  Shortness of breath.  History of recent pneumonia. EXAM: PORTABLE CHEST 1 VIEW COMPARISON:  09/30/2018 FINDINGS: Heart size is enlarged. Aortic calcifications are noted. Bibasilar atelectasis is noted. There may be trace bilateral pleural effusions. No pneumothorax. There is suggestion of mild volume overload. There is a linear metallic density projecting over the right upper lobe, favored to be external to the patient. IMPRESSION: Cardiomegaly with mild  volume overload. Electronically Signed   By: Constance Holster M.D.   On: 12/14/2018 14:28        Scheduled Meds:  atorvastatin  40 mg Oral QODAY   calcitonin  4 Units/kg Subcutaneous BID   carvedilol  12.5 mg Oral BID WC   chlorhexidine  15 mL Mouth Rinse BID   Chlorhexidine Gluconate Cloth  6 each Topical Q0600   cholecalciferol  2,000 Units Oral Daily   levothyroxine  75 mcg Intravenous Q0600   lidocaine  1 patch Transdermal Q24H   mouth rinse  15 mL Mouth Rinse q12n4p   mupirocin ointment   Nasal BID   nystatin  1 Bottle Topical BID   potassium chloride  10 mEq Oral Daily   senna-docusate  2 tablet Oral QHS   Continuous Infusions:  sodium chloride 150 mL/hr at 12/16/18 0515   cefTRIAXone (ROCEPHIN)  IV 1 g (12/15/18 1756)   heparin       LOS: 2 days        Aline August, MD [facility] 12/16/2018, 10:38 AM

## 2018-12-16 NOTE — Progress Notes (Signed)
Patient ID: [name], female   DOB: 1930/03/20, 85 y.o.   MRN: [id number]  This NP visited patient at the bedside as a follow up to  yesterday's [place] and to meet with daughter for continued conversation regarding diagnosis, prognosis, goals of care, end-of-life wishes, disposition and options.  The patient's grandson [name] is also present at the bedside.  Created space and opportunity for family to explore their thoughts and feelings regarding current medical situation.  Family verbalized an understanding the overall seriousness of the medical situation and the reality that their hope for her to return to a level of meaningful recovery is not likely.  Family verbalized an understanding that the patient would "not want to be like this" and that their hope now is for comfort and dignity.   Hard Choices booklet left for review  Plan of Care:  -DNR/DNI -No artificial feeding or hydration now or in the future--sips and chips as tolerated  -No further life prolonging measures; minimize medication, no diagnostics. - foley cath for EOL care -spiritual  care consult placed - transition to residential hospice for end-of-life care -Symptom management medications to enhance comfort  Natural trajectory and expectations at end of life discussed.  Questions and concerns addressed. Emotional support offered  Questions and concerns addressed   Discussed with Dr Starla Link  Total time spent on the unit was 60 minutes  Greater than 50% of the time was spent in counseling and coordination of care  Wadie Lessen NP  Palliative Medicine Team Team Phone # [phone] Pager [phone]

## 2018-12-17 DIAGNOSIS — Z515 Encounter for palliative care: Secondary | ICD-10-CM

## 2018-12-17 MED ORDER — MORPHINE SULFATE (CONCENTRATE) 10 MG/0.5ML PO SOLN: 5.0000 mg | ORAL | Status: AC | PRN

## 2018-12-17 NOTE — Progress Notes (Signed)
Spoke with patients nurse. Patient is not alert and is now comfort care.  Chaplain will follow as needed.   Jaclynn Major, [place], [facility], Pager [phone]

## 2018-12-17 NOTE — Progress Notes (Signed)
Patient ID: [name], female   DOB: [birth year], 85 y.o.   MRN: [id number]  PROGRESS NOTE    [name]  EQA:[id number] DOB: July 16, 1929 DOA: 12/14/2018 PCP: Maury Dus, MD   Brief Narrative:  85 year old female with history of CAD, atrial fibrillation on Xarelto, hypertension, heart failure, CKD stage III, CVA and dementia presented with altered mental status.  Work-up in the nursing home revealed UTI and community-acquired pneumonia as per daughter's report.  In the ED, she was found to have hypercalcemia.She was started on IV fluids for hypercalcemia and Rocephin for probable UTI.  Patient has remained almost unresponsive with worsening renal function.  After palliative care evaluation and discussion with family, family has decided to pursue full comfort measures and is requesting residential hospice placement.  She will be discharged to residential hospice once bed is available.  Overall prognosis is very poor.   Assessment & Plan:   Principal Problem:   Altered mental status, unspecified Active Problems:   Atrial fibrillation (HCC)   Chronic systolic CHF (congestive heart failure) (HCC)   CKD (chronic kidney disease), stage III (HCC)   Elevated troponin   Palliative care by specialist   DNR (do not resuscitate)   Dyspnea   Agitation  Acute metabolic encephalopathy in a patient with dementia Hypercalcemia Probable dehydration Hyponatremia Hypokalemia Hypomagnesemia Chronic systolic heart failure Probable UTI Chronic atrial fibrillation Acute kidney injury and chronic kidney disease stage IV Hypertension  Plan -She was started on IV fluids for hypercalcemia and Rocephin for probable UTI.  Patient has remained almost unresponsive with worsening renal function.  After palliative care evaluation and discussion with family, family has decided to pursue full comfort measures and is requesting residential hospice placement.  She will be discharged to residential hospice  once bed is available.  Overall prognosis is very poor.   DVT prophylaxis: None for comfort measures Code Status: DNR Family Communication: None at bedside Disposition Plan: Residential hospice once bed is available  Consultants: Palliative care  Procedures: None  Antimicrobials: Rocephin 12/14/2018-12/15/2018   Subjective: Patient seen and examined at bedside.  She is almost unresponsive.  Not in any distress.  Objective: Vitals:   12/16/18 2155 12/17/18 0128 12/17/18 0500 12/17/18 0536  BP: (!) 120/101 (!) 117/99  128/89  Pulse: 87 81  (!) 101  Resp: 20   18  Temp: 98.1 F (36.7 C)   97.7 F (36.5 C)  TempSrc: Oral   Oral  SpO2: 95%   99%  Weight:   84.2 kg   Height:        Intake/Output Summary (Last 24 hours) at 12/17/2018 1013 Last data filed at 12/17/2018 0807 Gross per 24 hour  Intake 29.6 ml  Output 1300 ml  Net -1270.4 ml   Filed Weights   12/14/18 1549 12/16/18 0500 12/17/18 0500  Weight: 83.8 kg 85 kg 84.2 kg    Examination:  General exam: Almost unresponsive.  No distress. Respiratory system: Bilateral decreased breath sounds at bases with some scattered crackles Cardiovascular system: S1 & S2 heard, Rate controlled   Data Reviewed: I have personally reviewed following labs and imaging studies  CBC: Recent Labs  Lab 12/14/18 1118 12/15/18 0421 12/16/18 0724  WBC 7.1 8.8 13.4*  NEUTROABS 4.9  --   --   HGB 12.6 12.1 12.4  HCT 37.1 36.0 37.8  MCV 90.7 92.3 93.6  PLT 164 156 979   Basic Metabolic Panel: Recent Labs  Lab 12/14/18 1118 12/14/18 1505 12/14/18 1701 12/15/18  0421 12/16/18 0724  NA 143  --  144 145 150*  K 2.5*  --  2.6* 3.6 2.8*  CL 101  --  100 109 116*  CO2 28  --  29 23 21*  GLUCOSE 96  --  93 121* 125*  BUN 39*  --  38* 37* 38*  CREATININE 2.75*  --  2.78* 2.50* 3.27*  CALCIUM 13.8*  --  13.5* 11.8* 10.6*  MG  --  1.1*  --   --  1.0*   GFR: Estimated Creatinine Clearance: 12.7 mL/min (A) (by C-G formula based  on SCr of 3.27 mg/dL (H)). Liver Function Tests: Recent Labs  Lab 12/14/18 1118 12/15/18 0421 12/16/18 0724  AST 45* 55* 34  ALT 24 27 23   ALKPHOS 70 70 68  BILITOT 1.3* 1.5* 1.0  PROT 6.6 6.2* 6.4*  ALBUMIN 3.3* 3.1* 3.0*   No results for input(s): LIPASE, AMYLASE in the last 168 hours. Recent Labs  Lab 12/16/18 0724  AMMONIA 21   Coagulation Profile: No results for input(s): INR, PROTIME in the last 168 hours. Cardiac Enzymes: No results for input(s): CKTOTAL, CKMB, CKMBINDEX, TROPONINI in the last 168 hours. BNP (last 3 results) No results for input(s): PROBNP in the last 8760 hours. HbA1C: No results for input(s): HGBA1C in the last 72 hours. CBG: Recent Labs  Lab 12/14/18 1043  GLUCAP 90   Lipid Profile: No results for input(s): CHOL, HDL, LDLCALC, TRIG, CHOLHDL, LDLDIRECT in the last 72 hours. Thyroid Function Tests: Recent Labs    12/16/18 0724  TSH 0.576   Anemia Panel: Recent Labs    12/16/18 0724  VITAMINB12 327  FOLATE 16.9   Sepsis Labs: Recent Labs  Lab 12/14/18 1701 12/14/18 2119 12/15/18 0421 12/16/18 0724  PROCALCITON <0.10  --  <0.10 <0.10  LATICACIDVEN 1.5 1.3  --   --     Recent Results (from the past 240 hour(s))  Novel Coronavirus,NAA,(SEND-OUT TO REF LAB - TAT 24-48 hrs); Hosp Order     Status: None   Collection Time: 12/14/18  1:15 PM   Specimen: Nasopharyngeal Swab; Respiratory  Result Value Ref Range Status   SARS-CoV-2, NAA NOT DETECTED NOT DETECTED Final    Comment: (NOTE) This test was developed and its performance characteristics determined by [employer]. This test has not been FDA cleared or approved. This test has been authorized by FDA under an Emergency Use Authorization (EUA). This test is only authorized for the duration of time the declaration that circumstances exist justifying the authorization of the emergency use of in vitro diagnostic tests for detection of SARS-CoV-2 virus and/or diagnosis of  COVID-19 infection under section 564(b)(1) of the Act, 21 U.S.C. 169CVE-9(F)(8), unless the authorization is terminated or revoked sooner. When diagnostic testing is negative, the possibility of a false negative result should be considered in the context of a patient's recent exposures and the presence of clinical signs and symptoms consistent with COVID-19. An individual without symptoms of COVID-19 and who is not shedding SARS-CoV-2 virus would expect to have a negative (not detected) result in this assay. Performed  At: [facility] [address] [place], [place] [id number] Rush Farmer MD EN:[id number]    [place]  Final    Comment: Performed at [facility], [place] [address], [place], [place] [address]  MRSA PCR Screening     Status: Abnormal   Collection Time: 12/14/18  3:53 PM   Specimen: Nasopharyngeal  Result Value Ref Range Status  MRSA by PCR POSITIVE (A) NEGATIVE Final    Comment:        The GeneXpert MRSA Assay (FDA approved for NASAL specimens only), is one component of a comprehensive MRSA colonization surveillance program. It is not intended to diagnose MRSA infection nor to guide or monitor treatment for MRSA infections. RESULT CALLED TO, READ BACK BY AND VERIFIED WITHJefferson Fuel RN 12/14/18 1748 JDW Performed at [facility], [place] [address], [place], [place] [address]   Urine Culture     Status: None   Collection Time: 12/14/18  7:42 PM   Specimen: Urine, Random  Result Value Ref Range Status   Specimen Description URINE, RANDOM  Final   Special Requests NONE  Final   Culture   Final    NO GROWTH Performed at [facility], [place] [address], [place],  [address]    Report Status 12/15/2018 FINAL  Final         Radiology Studies: No results found.      Scheduled Meds: . chlorhexidine  15 mL Mouth Rinse BID  . Chlorhexidine Gluconate Cloth  6 each Topical Q0600  . lidocaine  1 patch  Transdermal Q24H  . mouth rinse  15 mL Mouth Rinse q12n4p  . mupirocin ointment   Nasal BID  . nystatin  1 Bottle Topical BID   Continuous Infusions:   LOS: 3 days        Aline August, MD [facility] 12/17/2018, 10:13 AM

## 2018-12-17 NOTE — Plan of Care (Signed)
  Problem: Safety: Goal: Ability to remain free from injury will improve Outcome: Progressing   

## 2018-12-17 NOTE — Progress Notes (Addendum)
Manufacturing engineer [facility]) Hospice  Received request for residential hospice at [facility] from [facility].  Confirmed interest with daughter Jenny Reichmann.  Patient and chart under review by [facility] MD regarding eligibility and availability at [facility].    Will update family and TOC once decision has been made.  Thank you, Venia Carbon RN, BSN, [facility] Liaison (in [place]) [phone]  **Update, 1300, [place] will have a bed today for [name].  Required paperwork to be completed at 230.  Will update Percell Locus when complete to arrange transport.  Please fax d/c summary to [phone]  RN staff, please remove IVs and leave foley in place.  You may call report to [phone] at any time... bed assignment will be determined at that time

## 2018-12-17 NOTE — Progress Notes (Signed)
Pt discharge to residential hospice. Pt Disorented x4. PIV taken out intact. Site clean. PTAR called. AVS on chart. Called report to facility. Family aware of transport.

## 2018-12-17 NOTE — TOC Transition Note (Signed)
Transition of Care [facility]) - CM/SW Discharge Note   Patient Details  Name: [name] MRN: [id number] Date of Birth: October 19, 1929  Transition of Care [facility]) CM/SW Contact:  Benard Halsted, LCSW Phone Number: 12/17/2018, 3:01 PM   Clinical Narrative:    Patient will DC to: [facility] Anticipated DC date: 12/17/18 Family notified: Daughter Transport by: Corey Harold   Per MD patient ready for DC to [facility] RN, patient, patient's family, and facility notified of DC. Discharge Summary sent to facility. RN to call report prior to discharge [phone]). DC packet on chart. Ambulance transport requested for patient.   CSW will sign off for now as social work intervention is no longer needed. Please consult [place] again if new needs arise.  Cedric Fishman, LCSW Clinical Social Worker [phone]    Final next level of care: [place] Barriers to Discharge: No Barriers Identified   Patient Goals and CMS Choice Patient states their goals for this hospitalization and ongoing recovery are:: Comfort CMS Medicare.gov Compare Post Acute Care list provided to:: Patient Choice offered to / list presented to : Adult Children  Discharge Placement                Patient to be transferred to facility by: PTAR Name of family member notified: Daughter Patient and family notified of of transfer: 12/17/18  Discharge Plan and Services In-house Referral: Clinical Social Work, Hospice / Palliative Care Discharge Planning Services: NA Post Acute Care Choice: Hospice          DME Arranged: N/A         HH Arranged: NA          Social Determinants of Health (SDOH) Interventions     Readmission Risk Interventions No flowsheet data found.

## 2018-12-17 NOTE — TOC Initial Note (Signed)
Transition of Care [facility]) - Initial/Assessment Note    Patient Details  Name: [name] MRN: [id number] Date of Birth: December 09, 1929  Transition of Care [facility]) CM/SW Contact:    Benard Halsted, LCSW Phone Number: 12/17/2018, 10:32 AM  Clinical Narrative:                 CSW received consult regarding residential hospice placement. CSW spoke with patient's daughter, Jenny Reichmann. She confirms request for [facility]. CSW sent referral for review.   Expected Discharge Plan: [place] Barriers to Discharge: Hospice Bed not available   Patient Goals and CMS Choice Patient states their goals for this hospitalization and ongoing recovery are:: Comfort CMS Medicare.gov Compare Post Acute Care list provided to:: Patient Represenative (must comment)(Daughter) Choice offered to / list presented to : Adult Children  Expected Discharge Plan and Services Expected Discharge Plan: [place] In-house Referral: Clinical Social Work, Hospice / Palliative Care Discharge Planning Services: NA Post Acute Care Choice: Hospice Living arrangements for the past 2 months: [place]                 DME Arranged: N/A         HH Arranged: NA          Prior Living Arrangements/Services Living arrangements for the past 2 months: [place] Lives with:: Facility Resident Patient language and need for interpreter reviewed:: Yes Do you feel safe going back to the place where you live?: No      Need for Family Participation in Patient Care: Yes (Comment) Care giver support system in place?: Yes (comment)   Criminal Activity/Legal Involvement Pertinent to Current Situation/Hospitalization: No - Comment as needed  Activities of Daily Living      Permission Sought/Granted Permission sought to share information with : Facility Sport and exercise psychologist, Family Supports Permission granted to share information with : No  Share Information with NAME:  Jenny Reichmann  Permission granted to share info w AGENCY: Hospice  Permission granted to share info w Relationship: Daughter  Permission granted to share info w Contact Information: [phone]  Emotional Assessment Appearance:: Appears stated age Attitude/Demeanor/Rapport: Unable to Assess Affect (typically observed): Unable to Assess Orientation: : (Disoriented) Alcohol / Substance Use: Not Applicable Psych Involvement: No (comment)  Admission diagnosis:  Hypokalemia [E87.6] Patient Active Problem List   Diagnosis Date Noted  . Palliative care by specialist   . DNR (do not resuscitate)   . Dyspnea   . Agitation   . Altered mental status, unspecified 12/14/2018  . CAD (coronary artery disease) 10/01/2018  . Near syncope 09/30/2018  . Acute-on-chronic kidney injury ([place]) 09/30/2018  . Elevated troponin 09/30/2018  . Supratherapeutic INR 09/30/2018  . History of cerebrovascular accident (CVA) with residual deficit 09/30/2018  . Anemia 01/21/2016  . Left-sided chest wall pain 01/21/2016  . CKD (chronic kidney disease), stage III ([place]) 01/21/2016  . Hypothyroidism 01/21/2016  . Atrial fibrillation ([place])   . Chronic systolic CHF (congestive heart failure) ([place])    PCP:  Maury Dus, MD Pharmacy:  No Pharmacies Listed    Social Determinants of Health (SDOH) Interventions    Readmission Risk Interventions No flowsheet data found.

## 2018-12-17 NOTE — Care Management Important Message (Signed)
Important Message  Patient Details  Name: [name] MRN: [id number] Date of Birth: 1930/01/06   Medicare Important Message Given:  Yes     Orbie Pyo 12/17/2018, 11:42 AM

## 2018-12-17 NOTE — Progress Notes (Signed)
Nutrition Brief Note  Chart reviewed. Pt now transitioning to comfort care.  No further nutrition interventions warranted at this time.  Please re-consult as needed.   Marielle Mantione A. Merwyn Hodapp, RD, LDN, CDCES Registered Dietitian II Certified Diabetes Care and Education Specialist Pager: [phone] After hours Pager: [phone]  

## 2018-12-17 NOTE — Discharge Summary (Signed)
Physician Discharge Summary  GWENNA FUSTON EXH:[id number] DOB: [birth year] DOA: 12/14/2018  PCP: Maury Dus, MD  Admit date: 12/14/2018 Discharge date: 12/17/2018  Admitted From: SNF Disposition: Residential hospice  Recommendations for Outpatient Follow-up:  Follow up with residential hospice at earliest [place]: No Equipment/Devices: None  Discharge Condition: Poor CODE STATUS: DNR Diet recommendation: Per comfort measures  Brief/Interim Summary: 85 year old female with history of CAD, atrial fibrillation on Xarelto, hypertension, heart failure, CKD stage III, CVA and dementia presented with altered mental status. Work-up in the nursing home revealed UTI and community-acquired pneumonia as per daughter's report. In the ED, she was found to have hypercalcemia.She was started on IV fluids for hypercalcemia and Rocephin for probable UTI.  Patient has remained almost unresponsive with worsening renal function.  After palliative care evaluation and discussion with family, family has decided to pursue full comfort measures and is requesting residential hospice placement.  She will be discharged to residential hospice once bed is available.  Overall prognosis is very poor.   Discharge Diagnoses:  Acute metabolic encephalopathy in a patient with dementia Hypercalcemia Probable dehydration Hyponatremia Hypokalemia Hypomagnesemia Chronic systolic heart failure Probable UTI Chronic atrial fibrillation Acute kidney injury and chronic kidney disease stage IV Hypertension  Plan -She was started on IV fluids for hypercalcemia and Rocephin for probable UTI.  Patient has remained almost unresponsive with worsening renal function.  After palliative care evaluation and discussion with family, family has decided to pursue full comfort measures and is requesting residential hospice placement.  She will be discharged to residential hospice once bed is available.  Overall  prognosis is very poor.  Discharge Instructions   Allergies as of 12/17/2018      Reactions   Avelox [moxifloxacin Hcl In Nacl] Other (See Comments)   "didn't know what was going on, lost 3 days"   Doxycycline Hives, Itching, Rash      Medication List    STOP taking these medications   acetaminophen 325 MG tablet Commonly known as: TYLENOL   alendronate 70 MG tablet Commonly known as: FOSAMAX   atorvastatin 40 MG tablet Commonly known as: LIPITOR   calcium carbonate 1500 (600 Ca) MG Tabs tablet Commonly known as: OSCAL   carvedilol 6.25 MG tablet Commonly known as: COREG   cefTRIAXone 1 g/4 mLs in lidocaine (with preservative) injection   cholecalciferol 25 MCG (1000 UT) tablet Commonly known as: VITAMIN D   ferrous gluconate 324 MG tablet Commonly known as: FERGON   furosemide 40 MG tablet Commonly known as: LASIX   lactulose 10 GM/15ML solution Commonly known as: CHRONULAC   levothyroxine 125 MCG tablet Commonly known as: SYNTHROID   LORazepam 2 MG/ML injection Commonly known as: ATIVAN   losartan 25 MG tablet Commonly known as: COZAAR   nystatin powder Generic drug: nystatin   OVER THE COUNTER MEDICATION   OXYGEN   potassium chloride 10 MEQ tablet Commonly known as: K-DUR   senna-docusate 8.6-50 MG tablet Commonly known as: Senokot-S   solifenacin 10 MG tablet Commonly known as: VESICARE   Xarelto 20 MG Tabs tablet Generic drug: rivaroxaban     TAKE these medications   lidocaine 5 % Commonly known as: LIDODERM Place 1 patch onto the skin daily. Remove & Discard patch within 12 hours or as directed by MD   morphine CONCENTRATE 10 MG/0.5ML Soln concentrated solution Take 0.25 mLs (5 mg total) by mouth every 2 (two) hours as needed for moderate pain or shortness of breath.  Allergies  Allergen Reactions  . Avelox [Moxifloxacin Hcl In Nacl] Other (See Comments)    "didn't know what was going on, lost 3 days"  . Doxycycline  Hives, Itching and Rash    Consultations:  Palliative care   Procedures/Studies: Ct Head Wo Contrast  Result Date: 12/14/2018 CLINICAL DATA:  Altered level of consciousness.  Combative. EXAM: CT HEAD WITHOUT CONTRAST TECHNIQUE: Contiguous axial images were obtained from the base of the skull through the vertex without intravenous contrast. COMPARISON:  MRI and CT 09/30/2018 FINDINGS: Brain: Generalized atrophy. Considerable motion degradation. Chronic small-vessel changes of the white matter. No sign of acute infarction, mass lesion, hemorrhage, hydrocephalus or extra-axial collection. Vascular: No abnormal vascular finding. Skull: Negative as seen. Sinuses/Orbits: Negative as seen. Other: None IMPRESSION: Pronounced motion degradation. Generalized atrophy. Chronic small-vessel ischemic changes of the white matter. No acute finding appreciable. Electronically Signed   By: Nelson Chimes M.D.   On: 12/14/2018 12:36   Dg Chest Port 1 View  Result Date: 12/14/2018 CLINICAL DATA:  Shortness of breath.  History of recent pneumonia. EXAM: PORTABLE CHEST 1 VIEW COMPARISON:  09/30/2018 FINDINGS: Heart size is enlarged. Aortic calcifications are noted. Bibasilar atelectasis is noted. There may be trace bilateral pleural effusions. No pneumothorax. There is suggestion of mild volume overload. There is a linear metallic density projecting over the right upper lobe, favored to be external to the patient. IMPRESSION: Cardiomegaly with mild volume overload. Electronically Signed   By: Constance Holster M.D.   On: 12/14/2018 14:28       Subjective: Patient seen and examined at bedside.  She is almost unresponsive.  Not in any distress  Discharge Exam: Vitals:   12/17/18 0536 12/17/18 1213  BP: 128/89 (!) 131/114  Pulse: (!) 101 (!) 115  Resp: 18 18  Temp: 97.7 F (36.5 C) 98.4 F (36.9 C)  SpO2: 99% 95%   General exam: Almost unresponsive.  No distress. Respiratory system: Bilateral decreased  breath sounds at bases with some scattered crackles Cardiovascular system: S1 & S2 heard, Rate controlled    The results of significant diagnostics from this hospitalization (including imaging, microbiology, ancillary and laboratory) are listed below for reference.     Microbiology: Recent Results (from the past 240 hour(s))  Novel Coronavirus,NAA,(SEND-OUT TO REF LAB - TAT 24-48 hrs); Hosp Order     Status: None   Collection Time: 12/14/18  1:15 PM   Specimen: Nasopharyngeal Swab; Respiratory  Result Value Ref Range Status   SARS-CoV-2, NAA NOT DETECTED NOT DETECTED Final    Comment: (NOTE) This test was developed and its performance characteristics determined by [employer]. This test has not been FDA cleared or approved. This test has been authorized by FDA under an Emergency Use Authorization (EUA). This test is only authorized for the duration of time the declaration that circumstances exist justifying the authorization of the emergency use of in vitro diagnostic tests for detection of SARS-CoV-2 virus and/or diagnosis of COVID-19 infection under section 564(b)(1) of the Act, 21 U.S.C. 767HAL-9(F)(7), unless the authorization is terminated or revoked sooner. When diagnostic testing is negative, the possibility of a false negative result should be considered in the context of a patient's recent exposures and the presence of clinical signs and symptoms consistent with COVID-19. An individual without symptoms of COVID-19 and who is not shedding SARS-CoV-2 virus would expect to have a negative (not detected) result in this assay. Performed  At: [facility] [address] [place], [place] [id number] Rush Farmer  MD PH:[id number]    Coronavirus Source NASOPHARYNGEAL  Final    Comment: Performed at [facility], [place] [address] [place], [place] [address]  MRSA PCR Screening     Status: Abnormal   Collection Time: 12/14/18  3:53 PM   Specimen:  Nasopharyngeal  Result Value Ref Range Status   MRSA by PCR POSITIVE (A) NEGATIVE Final    Comment:        The GeneXpert MRSA Assay (FDA approved for NASAL specimens only), is one component of a comprehensive MRSA colonization surveillance program. It is not intended to diagnose MRSA infection nor to guide or monitor treatment for MRSA infections. RESULT CALLED TO, READ BACK BY AND VERIFIED WITHJefferson Fuel RN 12/14/18 1748 JDW Performed at [facility], [place] [address] [place], [place] [address]   Urine Culture     Status: None   Collection Time: 12/14/18  7:42 PM   Specimen: Urine, Random  Result Value Ref Range Status   Specimen Description URINE, RANDOM  Final   Special Requests NONE  Final   Culture   Final    NO GROWTH Performed at [facility], [place] [address], [place], [place] [address]    Report Status 12/15/2018 FINAL  Final     Labs: BNP (last 3 results) No results for input(s): BNP in the last 8760 hours. Basic Metabolic Panel: Recent Labs  Lab 12/14/18 1118 12/14/18 1505 12/14/18 1701 12/15/18 0421 12/16/18 0724  NA 143  --  144 145 150*  K 2.5*  --  2.6* 3.6 2.8*  CL 101  --  100 109 116*  CO2 28  --  29 23 21*  GLUCOSE 96  --  93 121* 125*  BUN 39*  --  38* 37* 38*  CREATININE 2.75*  --  2.78* 2.50* 3.27*  CALCIUM 13.8*  --  13.5* 11.8* 10.6*  MG  --  1.1*  --   --  1.0*   Liver Function Tests: Recent Labs  Lab 12/14/18 1118 12/15/18 0421 12/16/18 0724  AST 45* 55* 34  ALT 24 27 23   ALKPHOS 70 70 68  BILITOT 1.3* 1.5* 1.0  PROT 6.6 6.2* 6.4*  ALBUMIN 3.3* 3.1* 3.0*   No results for input(s): LIPASE, AMYLASE in the last 168 hours. Recent Labs  Lab 12/16/18 0724  AMMONIA 21   CBC: Recent Labs  Lab 12/14/18 1118 12/15/18 0421 12/16/18 0724  WBC 7.1 8.8 13.4*  NEUTROABS 4.9  --   --   HGB 12.6 12.1 12.4  HCT 37.1 36.0 37.8  MCV 90.7 92.3 93.6  PLT 164 156 172   Cardiac Enzymes: No results for input(s): CKTOTAL,  CKMB, CKMBINDEX, TROPONINI in the last 168 hours. BNP: Invalid input(s): POCBNP CBG: Recent Labs  Lab 12/14/18 1043  GLUCAP 90   D-Dimer No results for input(s): DDIMER in the last 72 hours. Hgb A1c No results for input(s): HGBA1C in the last 72 hours. Lipid Profile No results for input(s): CHOL, HDL, LDLCALC, TRIG, CHOLHDL, LDLDIRECT in the last 72 hours. Thyroid function studies Recent Labs    12/16/18 0724  TSH 0.576   Anemia work up Recent Labs    12/16/18 0724  VITAMINB12 327  FOLATE 16.9   Urinalysis    Component Value Date/Time   COLORURINE AMBER (A) 12/14/2018 1720   APPEARANCEUR TURBID (A) 12/14/2018 1720   LABSPEC 1.012 12/14/2018 1720   PHURINE 7.0 12/14/2018 [place] 12/14/2018 1720  HGBUR MODERATE (A) 12/14/2018 1720   BILIRUBINUR NEGATIVE 12/14/2018 1720   KETONESUR NEGATIVE 12/14/2018 1720   PROTEINUR 100 (A) 12/14/2018 1720   UROBILINOGEN 0.2 12/12/2011 2221   NITRITE NEGATIVE 12/14/2018 1720   LEUKOCYTESUR LARGE (A) 12/14/2018 1720   Sepsis Labs Invalid input(s): PROCALCITONIN,  WBC,  LACTICIDVEN Microbiology Recent Results (from the past 240 hour(s))  Novel Coronavirus,NAA,(SEND-OUT TO REF LAB - TAT 24-48 hrs); Hosp Order     Status: None   Collection Time: 12/14/18  1:15 PM   Specimen: Nasopharyngeal Swab; Respiratory  Result Value Ref Range Status   SARS-CoV-2, NAA NOT DETECTED NOT DETECTED Final    Comment: (NOTE) This test was developed and its performance characteristics determined by [employer]. This test has not been FDA cleared or approved. This test has been authorized by FDA under an Emergency Use Authorization (EUA). This test is only authorized for the duration of time the declaration that circumstances exist justifying the authorization of the emergency use of in vitro diagnostic tests for detection of SARS-CoV-2 virus and/or diagnosis of COVID-19 infection under section 564(b)(1) of the Act, 21  U.S.C. 707EML-5(Q)(4), unless the authorization is terminated or revoked sooner. When diagnostic testing is negative, the possibility of a false negative result should be considered in the context of a patient's recent exposures and the presence of clinical signs and symptoms consistent with COVID-19. An individual without symptoms of COVID-19 and who is not shedding SARS-CoV-2 virus would expect to have a negative (not detected) result in this assay. Performed  At: [facility] [address] [place], [place] [id number] Rush Farmer MD RF:[id number]    [place]  Final    Comment: Performed at [facility], [place] [address] [place], [place] [address]  MRSA PCR Screening     Status: Abnormal   Collection Time: 12/14/18  3:53 PM   Specimen: Nasopharyngeal  Result Value Ref Range Status   MRSA by PCR POSITIVE (A) NEGATIVE Final    Comment:        The GeneXpert MRSA Assay (FDA approved for NASAL specimens only), is one component of a comprehensive MRSA colonization surveillance program. It is not intended to diagnose MRSA infection nor to guide or monitor treatment for MRSA infections. RESULT CALLED TO, READ BACK BY AND VERIFIED WITHJefferson Fuel RN 12/14/18 1748 JDW Performed at [facility], [place] [address] [place], [place] [address]   Urine Culture     Status: None   Collection Time: 12/14/18  7:42 PM   Specimen: Urine, Random  Result Value Ref Range Status   Specimen Description URINE, RANDOM  Final   Special Requests NONE  Final   Culture   Final    NO GROWTH Performed at [facility], [place] [address], [place], [place] [address]    Report Status 12/15/2018 FINAL  Final     Time coordinating discharge: 35 minutes  SIGNED:   Aline August, MD  [facility] 12/17/2018, 1:17 PM

## 2018-12-29 DEATH — deceased

## 2021-04-12 IMAGING — DX PORTABLE CHEST - 1 VIEW
1 series · 1 of 1 positions shown · non-contrast
Comparison: 09/30/2018

CLINICAL DATA: Shortness of breath.  History of recent pneumonia.

EXAM:
PORTABLE CHEST 1 VIEW

[chest ap]
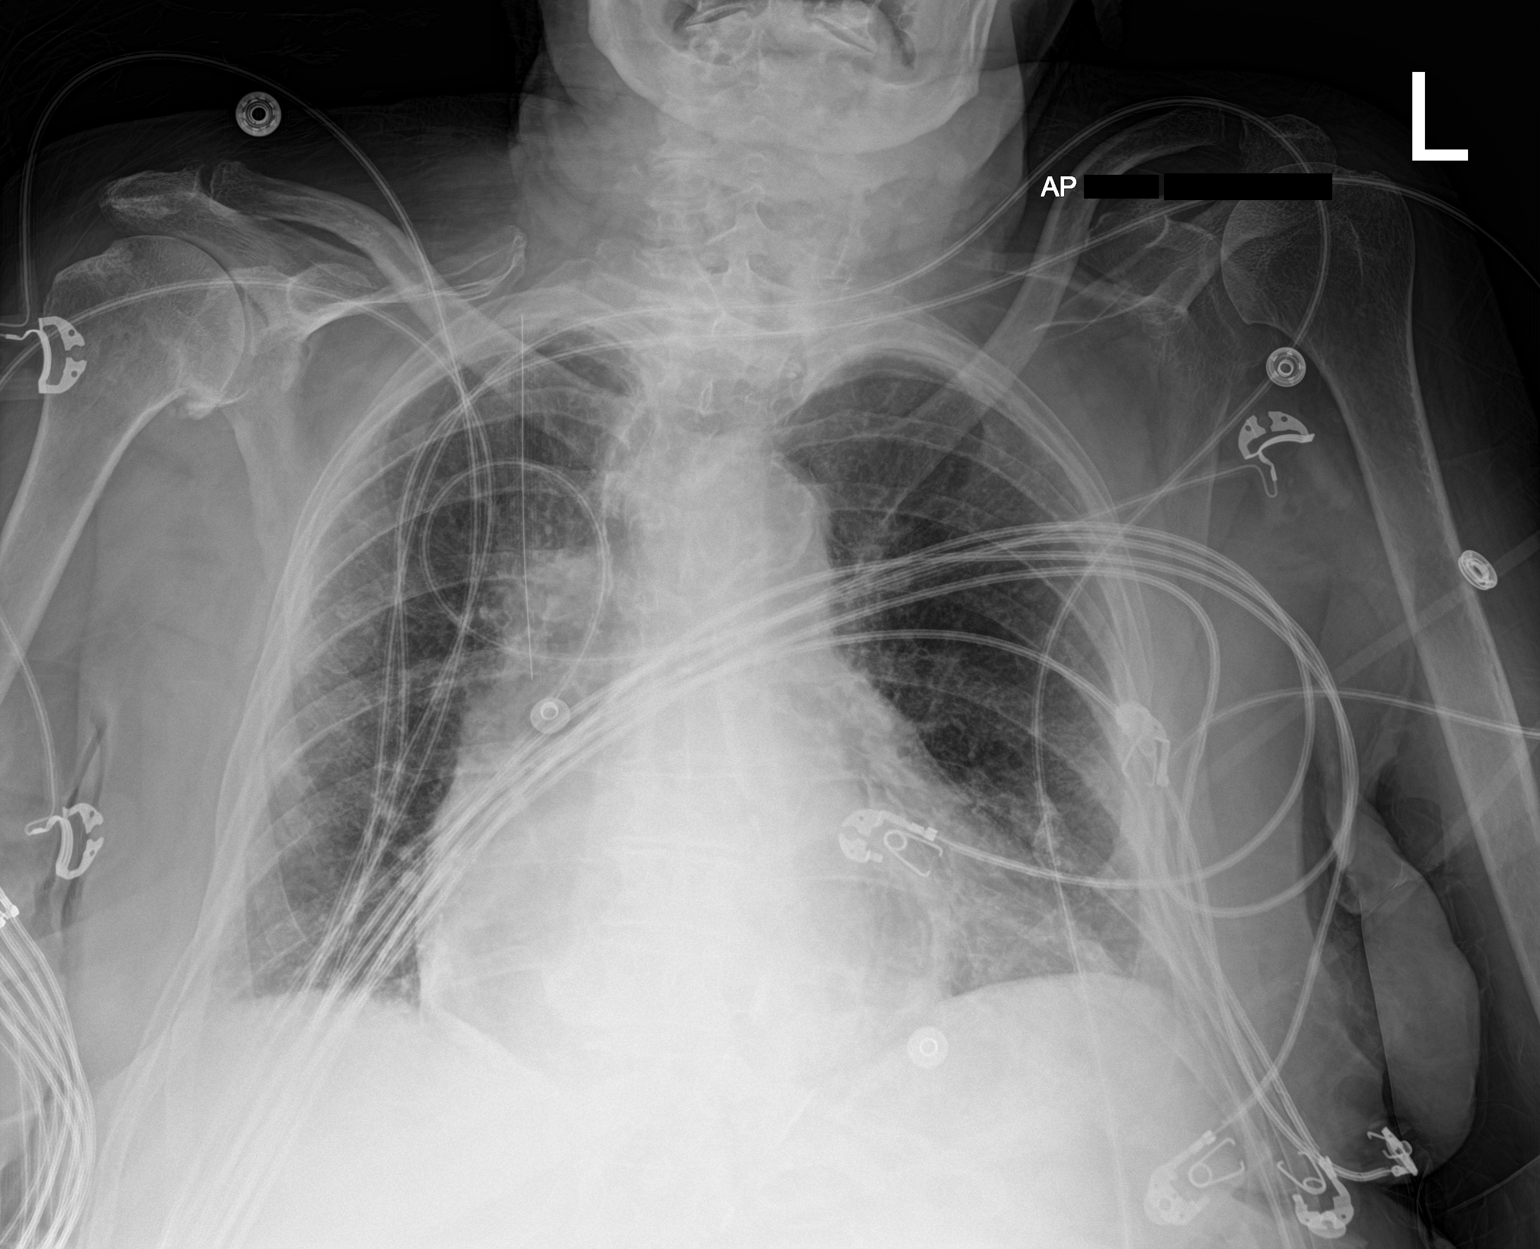

[1 of 1 positions shown; findings below may reference images not displayed]

FINDINGS: Heart size is enlarged. Aortic calcifications are noted. Bibasilar
atelectasis is noted. There may be trace bilateral pleural
effusions. No pneumothorax. There is suggestion of mild volume
overload. There is a linear metallic density projecting over the
right upper lobe, favored to be external to the patient.
IMPRESSION: Cardiomegaly with mild volume overload.

## 2021-04-12 IMAGING — CT CT HEAD WITHOUT CONTRAST
4 of 8 series · 18 of 47 positions shown, 19 images · non-contrast
Comparison: MRI and CT 09/30/2018

CLINICAL DATA: Altered level of consciousness.  Combative.

EXAM:
CT HEAD WITHOUT CONTRAST
TECHNIQUE: Contiguous axial images were obtained from the base of the skull
through the vertex without intravenous contrast.

[Series 3: head wo · axial · 0.47mm/px · z∈[-215,-110]mm · 4 of 37 slices shown, 5 images]
[im 8/37  brain]
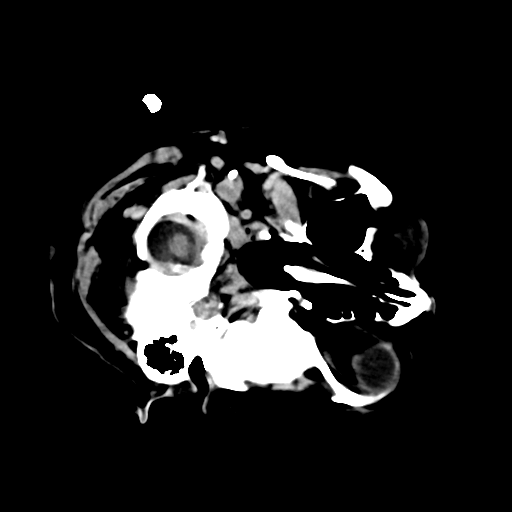
[im 8/37  bone]
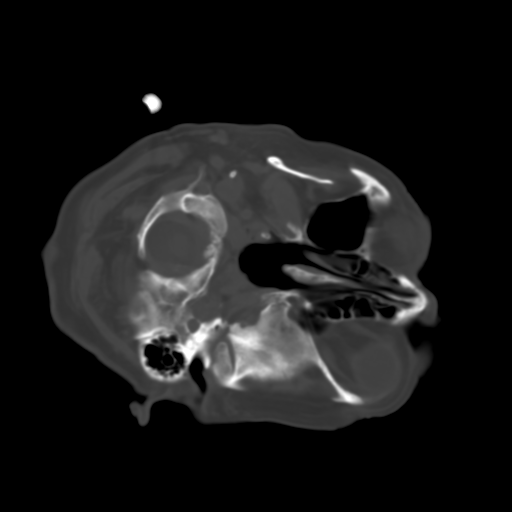
[im 15/37  brain]
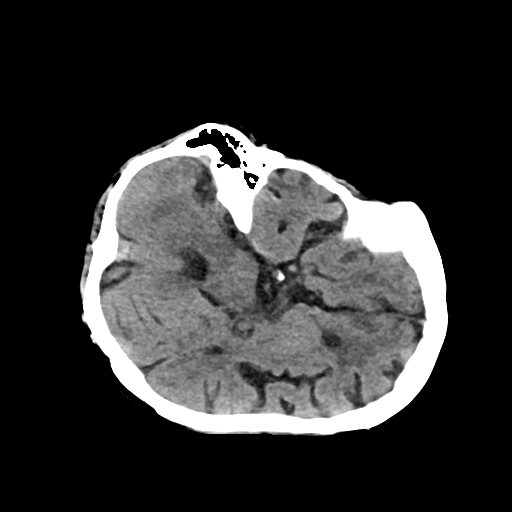
[im 22/37  brain]
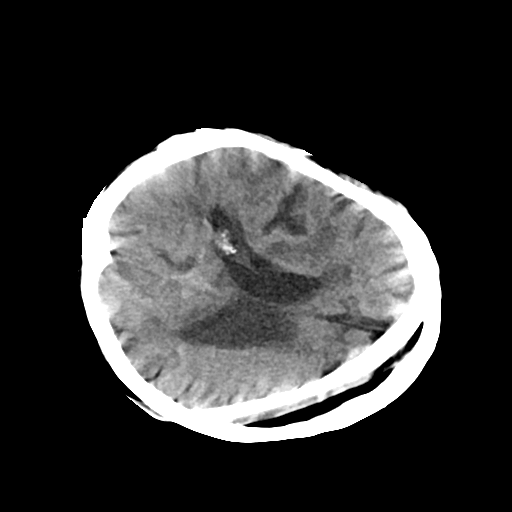
[im 29/37  brain]
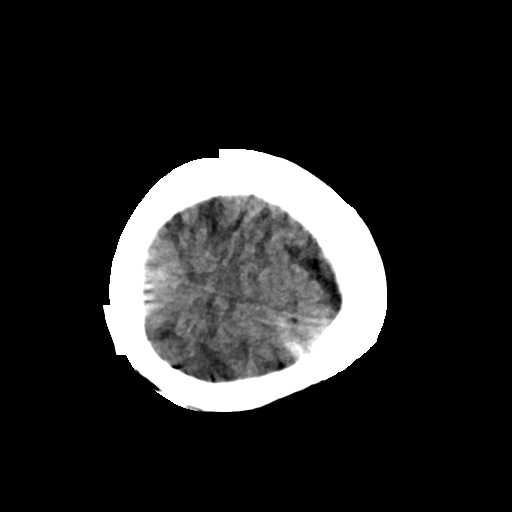

[Series 4: head bone · axial · 0.47mm/px · z∈[-236,-82]mm · 8 of 92 slices shown]
[im 8/92  bone]
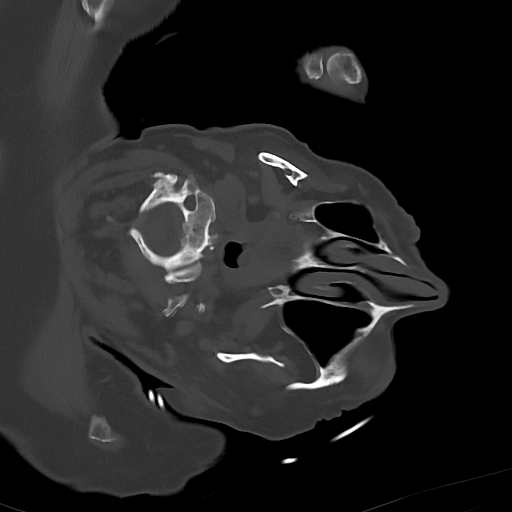
[im 22/92  bone]
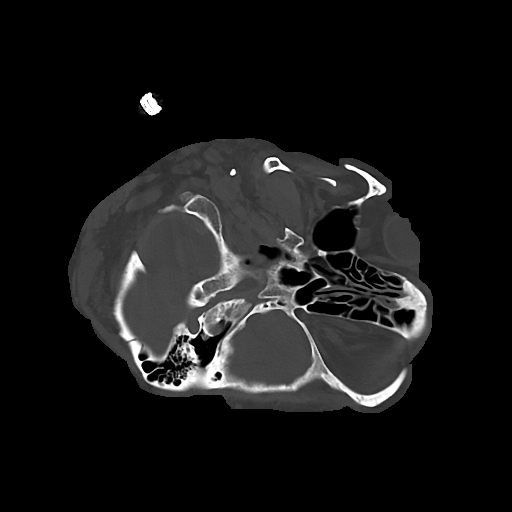
[im 29/92  bone]
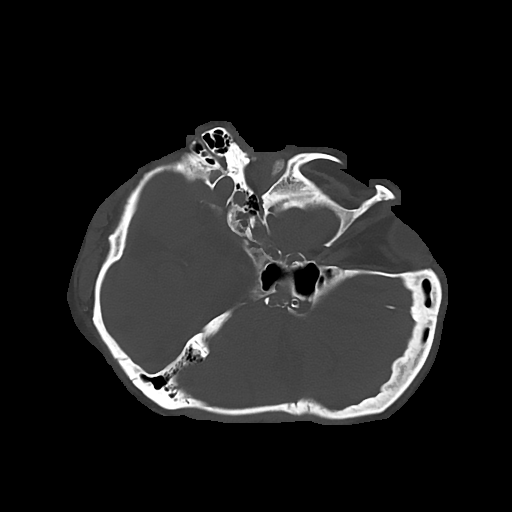
[im 43/92  bone]
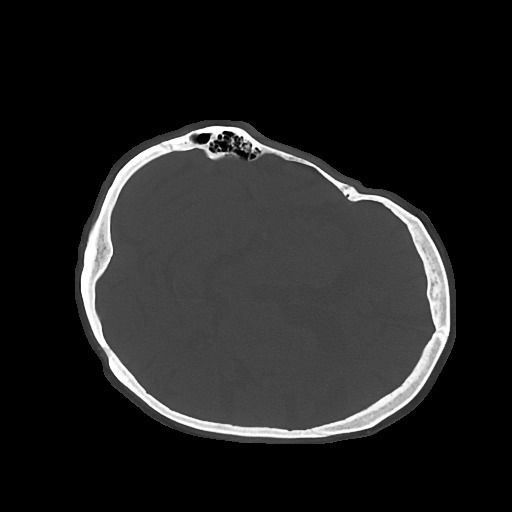
[im 50/92  bone]
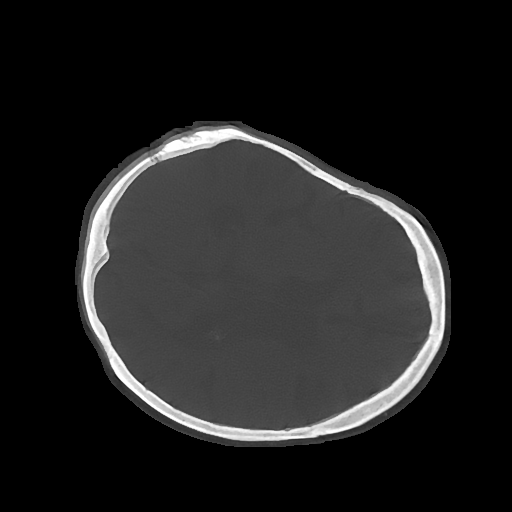
[im 64/92  bone]
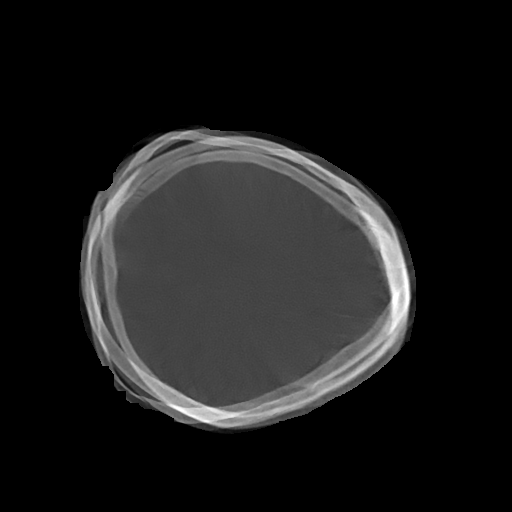
[im 71/92  bone]
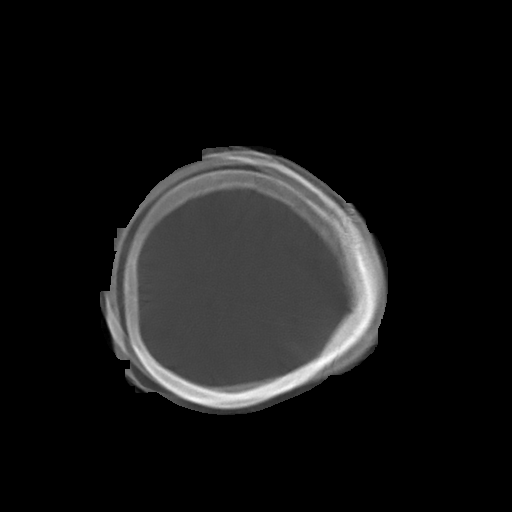
[im 85/92  bone]
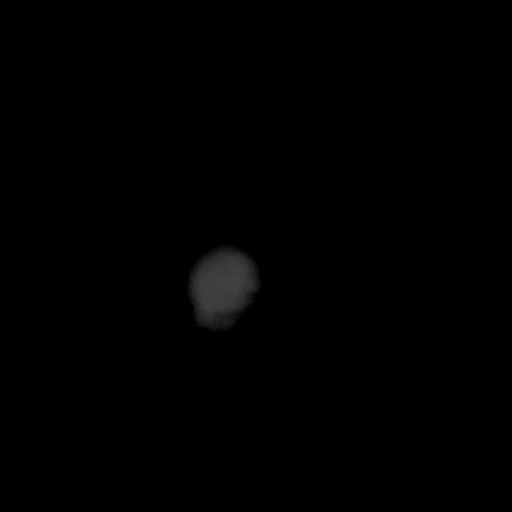

[Series 5: cor soft · coronal · 0.35mm/px · 3 of 61 slices shown]
[im 16/61  brain]
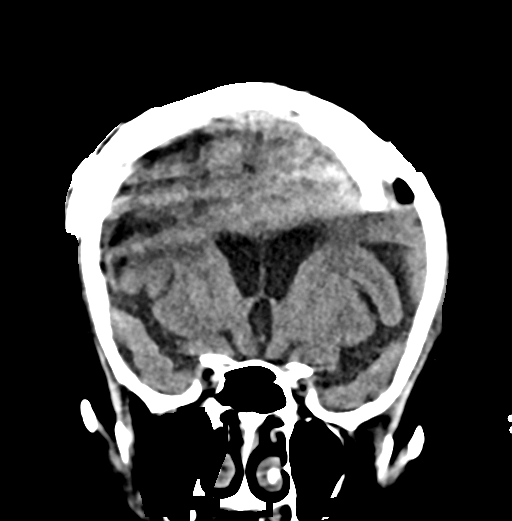
[im 31/61  brain]
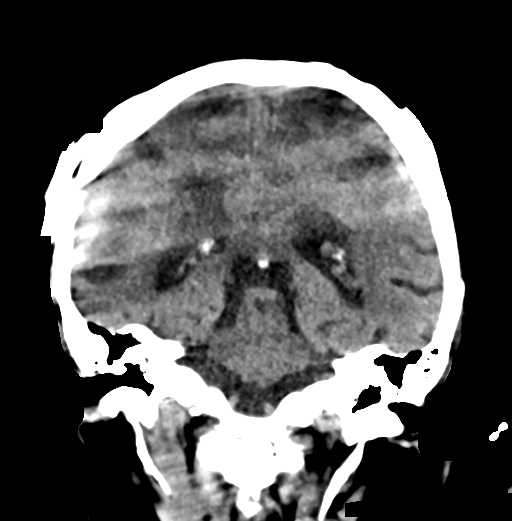
[im 46/61  brain]
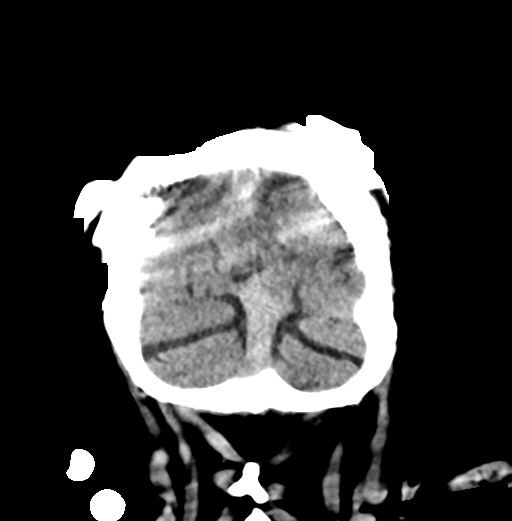

[Series 6: sag soft · sagittal · 0.36mm/px · 3 of 67 slices shown]
[im 2/67  brain]
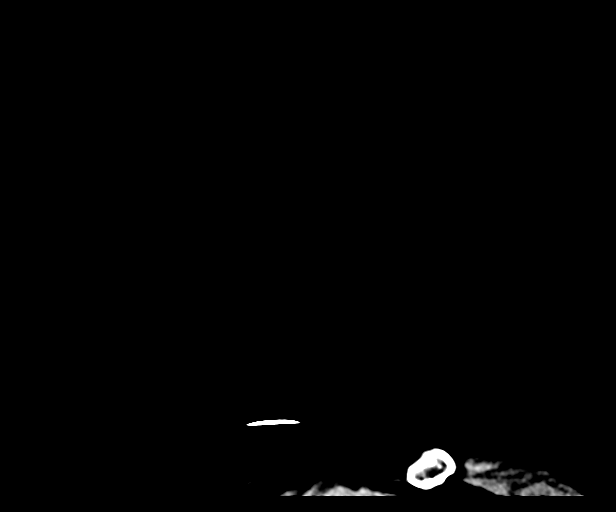
[im 23/67  brain]
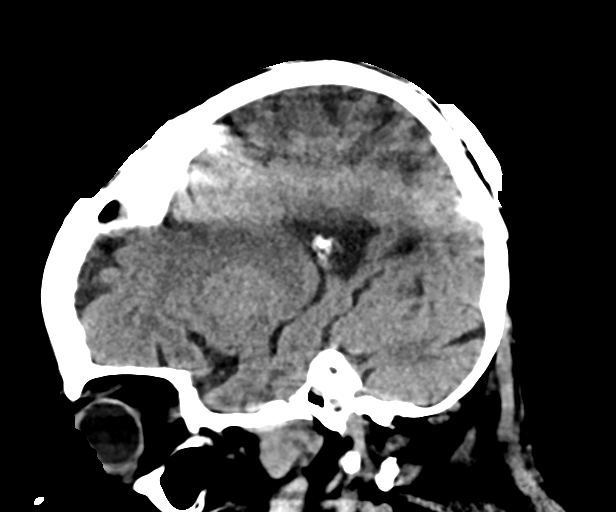
[im 44/67  brain]
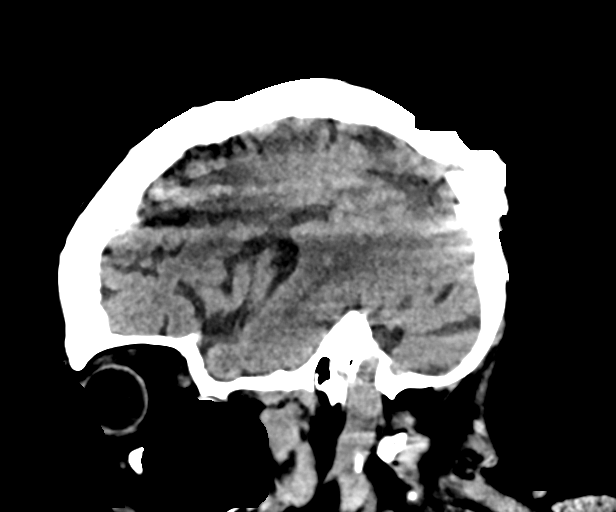

[18 of 47 positions shown; findings below may reference images not displayed]

FINDINGS: Brain: Generalized atrophy. Considerable motion degradation. Chronic
small-vessel changes of the white matter. No sign of acute
infarction, mass lesion, hemorrhage, hydrocephalus or extra-axial
collection.

Vascular: No abnormal vascular finding.

Skull: Negative as seen.

Sinuses/Orbits: Negative as seen.

Other: None
IMPRESSION: Pronounced motion degradation. Generalized atrophy. Chronic
small-vessel ischemic changes of the white matter. No acute finding
appreciable.
